# Patient Record
Sex: Male | Born: 1975 | Race: White | Hispanic: No | Marital: Married | Smoking: Never smoker
Health system: Southern US, Community
[De-identification: ages and names within clinical notes are randomized; demographics above are authoritative.]

## PROBLEM LIST (undated history)

## (undated) DIAGNOSIS — K760 Fatty (change of) liver, not elsewhere classified: Secondary | ICD-10-CM

## (undated) DIAGNOSIS — Z9109 Other allergy status, other than to drugs and biological substances: Secondary | ICD-10-CM

## (undated) DIAGNOSIS — I4719 Other supraventricular tachycardia: Secondary | ICD-10-CM

## (undated) DIAGNOSIS — N200 Calculus of kidney: Secondary | ICD-10-CM

## (undated) DIAGNOSIS — K5790 Diverticulosis of intestine, part unspecified, without perforation or abscess without bleeding: Secondary | ICD-10-CM

## (undated) DIAGNOSIS — N289 Disorder of kidney and ureter, unspecified: Secondary | ICD-10-CM

## (undated) HISTORY — PX: CHOLECYSTECTOMY: SHX55

## (undated) HISTORY — DX: Other supraventricular tachycardia: I47.19

## (undated) HISTORY — DX: Fatty (change of) liver, not elsewhere classified: K76.0

## (undated) HISTORY — DX: Diverticulosis of intestine, part unspecified, without perforation or abscess without bleeding: K57.90

## (undated) HISTORY — PX: KNEE SURGERY: SHX244

## (undated) HISTORY — DX: Calculus of kidney: N20.0

---

## 2016-06-14 ENCOUNTER — Encounter: Payer: Self-pay | Admitting: Emergency Medicine

## 2016-06-14 ENCOUNTER — Emergency Department (INDEPENDENT_AMBULATORY_CARE_PROVIDER_SITE_OTHER)
Admission: EM | Admit: 2016-06-14 | Discharge: 2016-06-14 | Disposition: A | Discharge status: Home / Self Care | Payer: BLUE CROSS/BLUE SHIELD | Source: Home / Self Care | Attending: Family Medicine | Admitting: Family Medicine

## 2016-06-14 DIAGNOSIS — IMO0002 Reserved for concepts with insufficient information to code with codable children: Secondary | ICD-10-CM

## 2016-06-14 DIAGNOSIS — L0211 Cutaneous abscess of neck: Secondary | ICD-10-CM

## 2016-06-14 DIAGNOSIS — L03221 Cellulitis of neck: Secondary | ICD-10-CM

## 2016-06-14 HISTORY — DX: Other allergy status, other than to drugs and biological substances: Z91.09

## 2016-06-14 MED ORDER — IBUPROFEN 600 MG PO TABS: 600.0000 mg | ORAL_TABLET | Freq: Four times a day (QID) | ORAL | 0 refills | Status: DC | PRN

## 2016-06-14 MED ORDER — DOXYCYCLINE HYCLATE 100 MG PO CAPS: 100.0000 mg | ORAL_CAPSULE | Freq: Two times a day (BID) | ORAL | 0 refills | Status: DC

## 2016-06-14 MED ORDER — HYDROCODONE-ACETAMINOPHEN 5-325 MG PO TABS: 1.0000 | ORAL_TABLET | Freq: Four times a day (QID) | ORAL | 0 refills | Status: DC | PRN

## 2016-06-14 NOTE — ED Triage Notes (Signed)
Pt c/o swollen abcess on back of neck x 1 week, redness, swelling, very painful, yellow drainage.

## 2016-06-14 NOTE — ED Provider Notes (Signed)
CSN: [id number]     Arrival date & time 06/14/16  1140 History   First MD Initiated Contact with Patient 06/14/16 1217     Chief Complaint  Patient presents with  . Abscess   (Consider location/radiation/quality/duration/timing/severity/associated sxs/prior Treatment) HPI Matthew Oconnor is a 40 y.o. male presenting to UC with c/o 1 week of gradually worsening painful, swollen, reddened sore on the back of his neck that has drained a small amount of yellow discharge. He has been taking Advil with mild relief. Pain is 7/10, aching and sore, keeps him up at night. Pt is unsure if he was bit by something or had an ingrown hair. No hx of similar abscess.    Past Medical History:  Diagnosis Date  . Environmental allergies    Past Surgical History:  Procedure Laterality Date  . CHOLECYSTECTOMY     Family History  Problem Relation Age of Onset  . Hypertension Other    Social History  Substance Use Topics  . Smoking status: Never Smoker  . Smokeless tobacco: Never Used  . Alcohol use Yes     Comment: 4 drinks a week    Review of Systems  Constitutional: Negative for chills and fever.  Gastrointestinal: Negative for nausea and vomiting.  Skin: Positive for color change and wound. Negative for rash.    Allergies  Flexeril [cyclobenzaprine]  Home Medications   Prior to Admission medications   Medication Sig Start Date End Date Taking? Authorizing Provider  doxycycline (VIBRAMYCIN) 100 MG capsule Take 1 capsule (100 mg total) by mouth 2 (two) times daily. One po bid x 7 days 06/14/16   Junius FinnerErin O'Malley, PA-C  HYDROcodone-acetaminophen (NORCO/VICODIN) 5-325 MG tablet Take 1-2 tablets by mouth every 6 (six) hours as needed for moderate pain or severe pain. 06/14/16   Junius FinnerErin O'Malley, PA-C  ibuprofen (ADVIL,MOTRIN) 600 MG tablet Take 1 tablet (600 mg total) by mouth every 6 (six) hours as needed. 06/14/16   Junius FinnerErin O'Malley, PA-C   Meds Ordered and Administered this Visit  Medications - No data  to display  BP 130/87 (BP Location: Left Arm)   Pulse 88   Temp 98 F (36.7 C) (Oral)   Ht 6' 0.5" (1.842 m)   Wt 235 lb 4 oz (106.7 kg)   SpO2 99%   BMI 31.47 kg/m  No data found.   Physical Exam  Constitutional: He is oriented to person, place, and time. He appears well-developed and well-nourished. No distress.  HENT:  Head: Normocephalic and atraumatic.  Eyes: EOM are normal.  Neck: Normal range of motion. Neck supple.  Cardiovascular: Normal rate.   Pulmonary/Chest: Effort normal.  Musculoskeletal: Normal range of motion.  Neurological: He is alert and oriented to person, place, and time.  Skin: Skin is warm and dry. Rash noted. He is not diaphoretic. There is erythema.  Posterior neck: 2cm area of erythema and induration. Centralized area of dried yellow discharge. No fluctuance.   Psychiatric: He has a normal mood and affect. His behavior is normal.  Nursing note and vitals reviewed.   Urgent Care Course   Clinical Course     Procedures (including critical care time)  Labs Review Labs Reviewed - No data to display  Imaging Review No results found.   MDM   1. Abscess or cellulitis, neck    Pt c/o worsening redness, pain and swelling at the back of his neck. Exam c/w cellulitis with underlying abscess. No fluctuance noted on exam. Centralized area of dried yellow discharge.  Area gentle massaged, no drainage coming from abscess. Do not believe I&D would be of benefit yet.  Will start pt on oral antibiotics and encouraged warm compresses several times a day to encourage abscess to drain. F/u in 3-4 days for recheck as abscess may benefit from I&D at that time.  Rx: Norco, ibuprofen, and doxycycline     Junius Finnerrin O'Malley, PA-C 06/14/16 1230

## 2016-08-12 ENCOUNTER — Emergency Department (INDEPENDENT_AMBULATORY_CARE_PROVIDER_SITE_OTHER)
Admission: EM | Admit: 2016-08-12 | Discharge: 2016-08-12 | Disposition: A | Discharge status: Home / Self Care | Payer: BLUE CROSS/BLUE SHIELD | Source: Home / Self Care | Attending: Family Medicine | Admitting: Family Medicine

## 2016-08-12 ENCOUNTER — Encounter: Payer: Self-pay | Admitting: Emergency Medicine

## 2016-08-12 DIAGNOSIS — L0211 Cutaneous abscess of neck: Secondary | ICD-10-CM

## 2016-08-12 MED ORDER — TRAMADOL HCL 50 MG PO TABS: 50.0000 mg | ORAL_TABLET | Freq: Four times a day (QID) | ORAL | 0 refills | Status: DC | PRN

## 2016-08-12 MED ORDER — MUPIROCIN 2 % EX OINT: TOPICAL_OINTMENT | CUTANEOUS | 0 refills | Status: DC

## 2016-08-12 MED ORDER — CLINDAMYCIN HCL 300 MG PO CAPS: 300.0000 mg | ORAL_CAPSULE | Freq: Four times a day (QID) | ORAL | 0 refills | Status: DC

## 2016-08-12 NOTE — ED Triage Notes (Signed)
Pt has abscess on right side of neck x3 days. Denies fever.

## 2016-08-12 NOTE — Discharge Instructions (Signed)
°  Tramadol is strong pain medication. While taking, do not drink alcohol, drive, or perform any other activities that requires focus while taking these medications.  ° °

## 2016-08-12 NOTE — ED Provider Notes (Signed)
CSN: [id number]     Arrival date & time 08/12/16  1143 History   First MD Initiated Contact with Patient 08/12/16 1205     Chief Complaint  Patient presents with  . Skin Ulcer   (Consider location/radiation/quality/duration/timing/severity/associated sxs/prior Treatment) HPI  Matthew Oconnor is a 41 y.o. male presenting to UC with c/o gradually worsening pain, swelling, and redness to the Right side of neck for 3-4 days.  Pain is aching and throbbing, 4/10.  This morning he took off  The bandage and noticed a small amount of white pus, scratched the sore, resulting in a "hole" in the abscess.  Denies fever, chills, n/v/d. He also notes a smaller sore on the back of his neck along his hairline but notes it is much smaller, pain is minimal.  He was seen on 06/14/16 for an abscess. Abscess was too indurated at that time for I&D.  He was treated with doxycycline, symptoms then resolved.  Pt is concerned new skin sores are coming back so quickly as he cannot recall any changes to his daily routine, soaps, lotions, or grooming habits.  He does not have a PCP.   Past Medical History:  Diagnosis Date  . Environmental allergies    Past Surgical History:  Procedure Laterality Date  . CHOLECYSTECTOMY     Family History  Problem Relation Age of Onset  . Hypertension Other    Social History  Substance Use Topics  . Smoking status: Never Smoker  . Smokeless tobacco: Never Used  . Alcohol use Yes     Comment: 4 drinks a week    Review of Systems  Constitutional: Negative for chills and fever.  Gastrointestinal: Negative for diarrhea, nausea and vomiting.  Musculoskeletal: Negative for arthralgias and myalgias.  Skin: Positive for color change and wound.    Allergies  Flexeril [cyclobenzaprine]  Home Medications   Prior to Admission medications   Medication Sig Start Date End Date Taking? Authorizing Provider  clindamycin (CLEOCIN) 300 MG capsule Take 1 capsule (300 mg total) by mouth 4  (four) times daily. X 7 days 08/12/16   Junius FinnerErin O'Malley, PA-C  doxycycline (VIBRAMYCIN) 100 MG capsule Take 1 capsule (100 mg total) by mouth 2 (two) times daily. One po bid x 7 days 06/14/16   Junius FinnerErin O'Malley, PA-C  HYDROcodone-acetaminophen (NORCO/VICODIN) 5-325 MG tablet Take 1-2 tablets by mouth every 6 (six) hours as needed for moderate pain or severe pain. 06/14/16   Junius FinnerErin O'Malley, PA-C  ibuprofen (ADVIL,MOTRIN) 600 MG tablet Take 1 tablet (600 mg total) by mouth every 6 (six) hours as needed. 06/14/16   Junius FinnerErin O'Malley, PA-C  mupirocin ointment (BACTROBAN) 2 % Apply to skin sore 3 times daily for 5 days. Apply to each nostril twice daily for 5 days 08/12/16   Junius FinnerErin O'Malley, PA-C  traMADol (ULTRAM) 50 MG tablet Take 1 tablet (50 mg total) by mouth every 6 (six) hours as needed. 08/12/16   Junius FinnerErin O'Malley, PA-C   Meds Ordered and Administered this Visit  Medications - No data to display  BP 126/81 (BP Location: Right Arm)   Pulse 80   Temp 97.8 F (36.6 C) (Oral)   Wt 242 lb (109.8 kg)   SpO2 98%   BMI 32.37 kg/m  No data found.   Physical Exam  Constitutional: He is oriented to person, place, and time. He appears well-developed and well-nourished. No distress.  HENT:  Head: Normocephalic and atraumatic.    1cm open abscess with small amount of blood and  purulent discharge present. Surrounding tendenress and induration.  Eyes: EOM are normal.  Neck: Normal range of motion. Neck supple.  Cardiovascular: Normal rate.   Pulmonary/Chest: Effort normal.  Musculoskeletal: Normal range of motion. He exhibits no edema.  Neurological: He is alert and oriented to person, place, and time.  Skin: Skin is warm and dry. Rash noted. He is not diaphoretic. There is erythema.  Abscess to Right side of neck.  Psychiatric: He has a normal mood and affect. His behavior is normal.  Nursing note and vitals reviewed.   Urgent Care Course     .Marland KitchenIncision and Drainage Date/Time: 08/12/2016 12:39  PM Performed by: Junius Finner Authorized by: Donna Christen A   Consent:    Consent obtained:  Verbal   Consent given by:  Patient   Risks discussed:  Bleeding, incomplete drainage and pain   Alternatives discussed:  Alternative treatment (antibiotics only) Location:    Type:  Abscess   Size:  1   Location:  Neck   Neck location:  R anterior (Right anterior/lateral) Pre-procedure details:    Skin preparation:  Antiseptic wash Anesthesia (see MAR for exact dosages):    Anesthesia method:  Local infiltration   Local anesthetic:  Lidocaine 2% WITH epi Procedure type:    Complexity:  Simple Procedure details:    Needle aspiration: no     Incision types:  Single straight   Incision depth:  Dermal   Scalpel blade:  11   Wound management:  Probed and deloculated   Drainage:  Bloody and purulent   Drainage amount:  Scant   Wound treatment:  Wound left open   Packing materials:  None Post-procedure details:    Patient tolerance of procedure:  Tolerated well, no immediate complications    Wound culture sent.   Labs Review Labs Reviewed  WOUND CULTURE    Imaging Review No results found.    MDM   1. Abscess of skin of neck    Abscess started to drain spontaneously, however, in order to ensure majority of discharge expelled and to obtain culture, I&D performed.  Rx: Clindamycin, mupirocin ointment and tramadol  Home care instructions provided. Encouraged f/u with PCP and/or dermatologist if sores keep coming back. F/u with UC in 4-5 days if current symptoms not improving.     Junius Finner, PA-C 08/12/16 1242

## 2016-08-15 ENCOUNTER — Telehealth: Payer: Self-pay | Admitting: Emergency Medicine

## 2016-08-15 LAB — WOUND CULTURE: Gram Stain: NONE SEEN

## 2016-08-15 NOTE — Telephone Encounter (Signed)
Culture grew Staph, make sure to finish the ABT, keep it clean and dry, wash your hands, change bed lines, dont share bath towels, etc.

## 2017-08-12 ENCOUNTER — Emergency Department (HOSPITAL_COMMUNITY)
Admission: EM | Admit: 2017-08-12 | Discharge: 2017-08-12 | Disposition: A | Discharge status: Home / Self Care | Payer: BLUE CROSS/BLUE SHIELD | Attending: Emergency Medicine | Admitting: Emergency Medicine

## 2017-08-12 ENCOUNTER — Encounter (HOSPITAL_COMMUNITY): Payer: Self-pay

## 2017-08-12 ENCOUNTER — Emergency Department (HOSPITAL_COMMUNITY): Payer: BLUE CROSS/BLUE SHIELD

## 2017-08-12 DIAGNOSIS — K5732 Diverticulitis of large intestine without perforation or abscess without bleeding: Secondary | ICD-10-CM | POA: Diagnosis not present

## 2017-08-12 DIAGNOSIS — K5790 Diverticulosis of intestine, part unspecified, without perforation or abscess without bleeding: Secondary | ICD-10-CM

## 2017-08-12 DIAGNOSIS — K760 Fatty (change of) liver, not elsewhere classified: Secondary | ICD-10-CM | POA: Diagnosis not present

## 2017-08-12 DIAGNOSIS — K429 Umbilical hernia without obstruction or gangrene: Secondary | ICD-10-CM | POA: Insufficient documentation

## 2017-08-12 DIAGNOSIS — N200 Calculus of kidney: Secondary | ICD-10-CM | POA: Diagnosis not present

## 2017-08-12 DIAGNOSIS — R1031 Right lower quadrant pain: Secondary | ICD-10-CM | POA: Diagnosis present

## 2017-08-12 DIAGNOSIS — Z79899 Other long term (current) drug therapy: Secondary | ICD-10-CM | POA: Insufficient documentation

## 2017-08-12 LAB — CBC
HCT: 43.5 % (ref 39.0–52.0)
Hemoglobin: 13.9 g/dL (ref 13.0–17.0)
MCH: 26.3 pg (ref 26.0–34.0)
MCHC: 32 g/dL (ref 30.0–36.0)
MCV: 82.2 fL (ref 78.0–100.0)
Platelets: 212 10*3/uL (ref 150–400)
RBC: 5.29 MIL/uL (ref 4.22–5.81)
RDW: 12.7 % (ref 11.5–15.5)
WBC: 5.9 10*3/uL (ref 4.0–10.5)

## 2017-08-12 LAB — BASIC METABOLIC PANEL
ANION GAP: 10 (ref 5–15)
BUN: 9 mg/dL (ref 6–20)
CO2: 24 mmol/L (ref 22–32)
Calcium: 8.8 mg/dL — ABNORMAL LOW (ref 8.9–10.3)
Chloride: 101 mmol/L (ref 101–111)
Creatinine, Ser: 0.81 mg/dL (ref 0.61–1.24)
GFR calc Af Amer: >60 mL/min (ref >60)
GLUCOSE: 178 mg/dL — AB (ref 65–99)
Potassium: 3.8 mmol/L (ref 3.5–5.1)
SODIUM: 135 mmol/L (ref 135–145)

## 2017-08-12 LAB — URINALYSIS, ROUTINE W REFLEX MICROSCOPIC
BILIRUBIN URINE: NEGATIVE
Glucose, UA: NEGATIVE mg/dL
KETONES UR: 20 mg/dL — AB
LEUKOCYTES UA: NEGATIVE
Nitrite: NEGATIVE
Protein, ur: 100 mg/dL — AB
SPECIFIC GRAVITY, URINE: 1.029 (ref 1.005–1.030)
pH: 5 (ref 5.0–8.0)

## 2017-08-12 MED ORDER — SODIUM CHLORIDE 0.9 % IV BOLUS (SEPSIS)
500.0000 mL | Freq: Once | INTRAVENOUS | Status: AC
Start: 2017-08-12 — End: 2017-08-12
  Administered 2017-08-12: 500 mL via INTRAVENOUS

## 2017-08-12 MED ORDER — IOPAMIDOL (ISOVUE-300) INJECTION 61%
100.0000 mL | Freq: Once | INTRAVENOUS | Status: AC | PRN
  Administered 2017-08-12: 100 mL via INTRAVENOUS

## 2017-08-12 MED ORDER — HYDROCODONE-ACETAMINOPHEN 5-325 MG PO TABS: 1.0000 | ORAL_TABLET | Freq: Four times a day (QID) | ORAL | 0 refills | Status: DC | PRN

## 2017-08-12 MED ORDER — ONDANSETRON HCL 4 MG PO TABS: 4.0000 mg | ORAL_TABLET | Freq: Three times a day (TID) | ORAL | 0 refills | Status: DC | PRN

## 2017-08-12 MED ORDER — TAMSULOSIN HCL 0.4 MG PO CAPS: 0.4000 mg | ORAL_CAPSULE | Freq: Every day | ORAL | 0 refills | Status: DC

## 2017-08-12 MED ORDER — MORPHINE SULFATE (PF) 4 MG/ML IV SOLN
4.0000 mg | Freq: Once | INTRAVENOUS | Status: AC
  Administered 2017-08-12: 4 mg via INTRAVENOUS
  Filled 2017-08-12: qty 1

## 2017-08-12 NOTE — ED Notes (Signed)
Pt returned from ct

## 2017-08-12 NOTE — ED Triage Notes (Signed)
Pt reports woke up 2 hours ago with sudden onset of bilateral flank pain.  Reports pain then went to r flank.  Reports pt varies in intensity.  Reports voided once this am without difficulty but then thought he had to void again later this morning but couldn't go.

## 2017-08-12 NOTE — ED Provider Notes (Signed)
[facility]Y [facility]PART[facility] Provider Note   CSN: [id number] Arrival date & time: 08/12/17  [id number]     History   Chief Complaint Chief Complaint  Patient presents with  . Flank Pain    HPI Ninetta LightsBrian Bromell is a 42 y.o. male.  HPI   42 y/o male with a h/o cholecystectomy presenting to the ED today c/o sudden onset flank pain that began this morning when he woke up. Pain was initially in the bilateral flanks however now has moved to the right flank.  Intermittently radiates to the right mid abdomen.  Pain waxes and wanes.  It is currently 6/10, but at worst it was "27 "/10.  Patient took 800 mg ibuprofen at 7:30 AM, which has improved his pain somewhat.  Also reports nausea, malodorous urine, and dull, mild bilateral scrotal pain.  Denies fevers, chills, chest pain, SOB, vomiting, diarrhea, constipation, dysuria, penile discharge.  States he was able to urinate this morning with no issues. Now reports he is having some hesitancy.   Denies a personal h/o nephrolithiasis, but does has +family history.   Past Medical History:  Diagnosis Date  . Environmental allergies     There are no active problems to display for this patient.   Past Surgical History:  Procedure Laterality Date  . CHOLECYSTECTOMY         Home Medications    Prior to Admission medications   Medication Sig Start Date End Date Taking? Authorizing Provider  famotidine (PEPCID) 20 MG tablet Take 20 mg by mouth daily.   Yes [provider]  fexofenadine (ALLEGRA) 180 MG tablet Take 180 mg by mouth daily.   Yes [provider]  ibuprofen (ADVIL,MOTRIN) 600 MG tablet Take 1 tablet (600 mg total) by mouth every 6 (six) hours as needed. 06/14/16  Yes Phelps, Vangie BickerErin O, PA-C  HYDROcodone-acetaminophen (NORCO/VICODIN) 5-325 MG tablet Take 1 tablet by mouth every 6 (six) hours as needed. 08/12/17   Lequita Meadowcroft S, PA-C  ondansetron (ZOFRAN) 4 MG tablet Take 1 tablet (4 mg total) by mouth every 8 (eight)  hours as needed for nausea or vomiting. 08/12/17   Ayce Pietrzyk S, PA-C  tamsulosin (FLOMAX) 0.4 MG CAPS capsule Take 1 capsule (0.4 mg total) by mouth daily. 08/12/17   Analiz Tvedt S, PA-C    Family History Family History  Problem Relation Age of Onset  . Hypertension Other     Social History Social History   Tobacco Use  . Smoking status: Never Smoker  . Smokeless tobacco: Current User    Types: Chew  Substance Use Topics  . Alcohol use: Yes    Comment: 4 drinks a week  . Drug use: No     Allergies   Flexeril [cyclobenzaprine]   Review of Systems Review of Systems  Constitutional: Negative for chills and fever.  HENT: Negative for ear pain and sore throat.   Eyes: Negative for pain and visual disturbance.  Respiratory: Negative for cough and shortness of breath.   Cardiovascular: Negative for chest pain and palpitations.  Gastrointestinal: Positive for abdominal pain and nausea. Negative for constipation, diarrhea and vomiting.  Genitourinary: Positive for difficulty urinating, flank pain and testicular pain. Negative for discharge, dysuria, frequency, penile swelling and scrotal swelling.  Musculoskeletal: Negative for arthralgias and back pain.  Skin: Negative for color change and rash.  Neurological: Negative for seizures and syncope.  All other systems reviewed and are negative.    Physical Exam Updated Vital Signs BP 108/70  Pulse 64   Temp 97.7 F (36.5 C) (Oral)   Resp 16   Ht 6' (1.829 m)   Wt 113.4 kg (250 lb)   SpO2 100%   BMI 33.91 kg/m   Physical Exam  Constitutional: He appears well-developed and well-nourished. He appears distressed.  HENT:  Head: Normocephalic and atraumatic.  Eyes: Conjunctivae are normal.  Neck: Normal range of motion. Neck supple.  Cardiovascular: Normal rate, regular rhythm, normal heart sounds and intact distal pulses.  No murmur heard. Pulmonary/Chest: Effort normal and breath sounds normal. No respiratory  distress. He has no wheezes.  Abdominal: Soft. Bowel sounds are normal. He exhibits no distension and no mass. There is no tenderness. There is no guarding.  Right CVA TTP. No left CVA TTP. No overlying ecchymosis.  Genitourinary: Penis normal. Cremasteric reflex is present. Right testis shows no mass, no swelling and no tenderness. Left testis shows no mass, no swelling and no tenderness. Circumcised.  Musculoskeletal: He exhibits no edema.  No TTP to paraspinous muscles  Neurological: He is alert.  Skin: Skin is warm and dry. He is not diaphoretic.  Psychiatric: He has a normal mood and affect.  Nursing note and vitals reviewed.    ED Treatments / Results  Labs (all labs ordered are listed, but only abnormal results are displayed) Labs Reviewed  URINALYSIS, ROUTINE W REFLEX MICROSCOPIC - Abnormal; Notable for the following components:      Result Value   Color, Urine AMBER (*)    APPearance HAZY (*)    Hgb urine dipstick LARGE (*)    Ketones, ur 20 (*)    Protein, ur 100 (*)    Bacteria, UA RARE (*)    Squamous Epithelial / LPF 0-5 (*)    All other components within normal limits  BASIC METABOLIC PANEL - Abnormal; Notable for the following components:   Glucose, Bld 178 (*)    Calcium 8.8 (*)    All other components within normal limits  CBC    EKG  EKG Interpretation None       Radiology Ct Abdomen Pelvis W Contrast  Result Date: 08/12/2017 CLINICAL DATA:  Acute generalized abdominal pain, sudden onset of BILATERAL flank pain at 0630 hours today, difficulty urinating, history of cholecystectomy EXAM: CT ABDOMEN AND PELVIS WITH CONTRAST TECHNIQUE: Multidetector CT imaging of the abdomen and pelvis was performed using the standard protocol following bolus administration of intravenous contrast. Sagittal and coronal MPR images reconstructed from axial data set. CONTRAST:  ISOVUE-300 IOPAMIDOL (ISOVUE-300) INJECTION 61% IV. No oral contrast. COMPARISON:  None  FINDINGS: Lower chest: Lung bases clear Hepatobiliary: Diffuse fatty infiltration of liver. Gallbladder surgically absent. Pancreas: Normal appearance Spleen: Normal appearance Adrenals/Urinary Tract: Adrenal glands normal appearance. Mild RIGHT hydronephrosis secondary to a 4 mm RIGHT UPJ calculus. No renal mass or additional urinary tract calcification. No ureteral dilatation or LEFT hydronephrosis. Bladder unremarkable. Stomach/Bowel: Mild descending and sigmoid colonic diverticulosis without evidence of diverticulitis. Normal appendix. Stomach and small bowel loops unremarkable for technique. Vascular/Lymphatic: Few pelvic phleboliths. Vascular structures otherwise unremarkable. No adenopathy. Reproductive: Unremarkable prostate gland and seminal vesicles. Other: Small umbilical hernia containing fat. No free air or free fluid Musculoskeletal: Osseous structures unremarkable. IMPRESSION: Mild RIGHT hydronephrosis secondary to a 4 mm RIGHT UPJ calculus. Diffuse fatty infiltration of liver. Distal colonic diverticulosis without evidence of diverticulitis. Small umbilical hernia. Electronically Signed   By: Ulyses Southward M.D.   On: 08/12/2017 09:56    Procedures Procedures (including critical care time)  Medications Ordered in ED Medications  sodium chloride 0.9 % bolus 500 mL (0 mLs Intravenous Stopped 08/12/17 0945)  morphine 4 MG/ML injection 4 mg (4 mg Intravenous Given 08/12/17 0914)  iopamidol (ISOVUE-300) 61 % injection 100 mL (100 mLs Intravenous Contrast Given 08/12/17 0936)     Initial Impression / Assessment and Plan / ED Course  I have reviewed the triage vital signs and the nursing notes.  Pertinent labs & imaging results that were available during my care of the patient were reviewed by me and considered in my medical decision making (see chart for details).  Clinical Course as of Aug 13 1847  Wed Aug 12, 2017  6730 42 year old male with acute onset of right flank radiating into right  mid abdomen pain severe intensity waxing and waning colicky associated with some urinary hesitancy.  Also associated with nausea.  His abdominal exam is benign.  He is getting labs fluids and urinalysis and his put in for a CT abdomen with contrast.  Differential includes ureterolithiasis, pyelonephritis, musculoskeletal, appendicitis.  [MB]    Clinical Course User Index [MB] Terrilee Files, MD    Discussed pt presentation and exam findings with Dr. Charm Barges, who will evaluate the pt. Discussed the plan to order labs and ct abd/pelvis. Dr. Charm Barges recommends ordering Ct abd/pelvis with contrast since pt has not had a h/o nephrolithiasis and may have additional underlying pathology that would be better visualized with contrast.   10:24 Discussed Pt case with Dr. Sherron Monday, urology, who recommends that pt call the office tomorrow to schedule a f/u appt. Advised to d/c patient with flomax, zofran, and pain medication.   Reviewed pt in [place] [facility] and pt has not had narcotic Rx since 03/2017.  10:46 Rechecked pt. He states he is feeling improved after morphine. Pain is now 2/10. He is laying in bed in NAD.  Discussed the labs and CT results.  Discussed the plan for discharge with Flomax, Zofran, and pain medication. advised him to take scheduled ibuprofen and to take Norco for breakthrough pain.  Advised not to work, drive, or operate heavy machinery while taking Norco.  Advised him to contact [facility] either today or tomorrow morning to schedule follow-up appointment.  Advised patient to follow-up with primary care doctor about additional diagnoses noted on CT scan today.  Advised him to return to the ER for any fevers, vomiting, intractable pain, or any new or worsening symptoms.  Final Clinical Impressions(s) / ED Diagnoses   Final diagnoses:  Nephrolithiasis  Fatty liver  Umbilical hernia without obstruction and without gangrene  Diverticulosis of intestine without bleeding,  unspecified intestinal tract location   43 y/o M with family hx of nephrolithiasis presenting with acute onset right flank pain radiating to right mid abdomen. Labs reassuring with no WBC elevation and normal kidney function. UA with large amount of hematuria, ketonuria, and proteinuria. CT abdomen showing 4mm right ureteral stone. Consulted Dr. Sherron Monday, urology, who made medication recommendations and will see pt in office.  Patient is nontoxic, nonseptic appearing, in no apparent distress.  Patient's pain and other symptoms adequately managed in emergency department.  Fluid bolus given.  Patient does not meet the SIRS or Sepsis criteria.  Abdominal exam does not have a surgical abdomen and there are no peritoneal signs.  No indication of appendicitis, bowel obstruction, bowel perforation, or diverticulitis.  Patient discharged home with flomax, zofran and pain meds and given strict instructions for follow-up with urology. Discussed all incidental findings on  CT and advised pt to f/u with pcp regarding these new diagnosis.  I have also discussed reasons to return immediately to the ER.  Patient expresses understanding and agrees with plan.  ED Discharge Orders        Ordered    tamsulosin (FLOMAX) 0.4 MG CAPS capsule  Daily     08/12/17 1032    ondansetron (ZOFRAN) 4 MG tablet  Every 8 hours PRN     08/12/17 1032    HYDROcodone-acetaminophen (NORCO/VICODIN) 5-325 MG tablet  Every 6 hours PRN     08/12/17 1032       Doriana Mazurkiewicz S, PA-C 08/12/17 1051    Dio Giller, [place], PA-C 08/12/17 1849    Terrilee Files, MD 08/12/17 [phone]

## 2018-02-18 ENCOUNTER — Encounter (HOSPITAL_COMMUNITY): Payer: Self-pay | Admitting: Emergency Medicine

## 2018-02-18 ENCOUNTER — Other Ambulatory Visit: Payer: Self-pay

## 2018-02-18 ENCOUNTER — Emergency Department (HOSPITAL_COMMUNITY)
Admission: EM | Admit: 2018-02-18 | Discharge: 2018-02-18 | Disposition: A | Discharge status: Home / Self Care | Payer: BLUE CROSS/BLUE SHIELD | Attending: Emergency Medicine | Admitting: Emergency Medicine

## 2018-02-18 ENCOUNTER — Emergency Department (HOSPITAL_COMMUNITY): Payer: BLUE CROSS/BLUE SHIELD

## 2018-02-18 DIAGNOSIS — R1030 Lower abdominal pain, unspecified: Secondary | ICD-10-CM | POA: Diagnosis present

## 2018-02-18 DIAGNOSIS — N200 Calculus of kidney: Secondary | ICD-10-CM | POA: Diagnosis not present

## 2018-02-18 DIAGNOSIS — Z79899 Other long term (current) drug therapy: Secondary | ICD-10-CM | POA: Diagnosis not present

## 2018-02-18 HISTORY — DX: Disorder of kidney and ureter, unspecified: N28.9

## 2018-02-18 LAB — CBC
HCT: 42.2 % (ref 39.0–52.0)
Hemoglobin: 14.2 g/dL (ref 13.0–17.0)
MCH: 27.2 pg (ref 26.0–34.0)
MCHC: 33.6 g/dL (ref 30.0–36.0)
MCV: 80.7 fL (ref 78.0–100.0)
PLATELETS: 233 10*3/uL (ref 150–400)
RBC: 5.23 MIL/uL (ref 4.22–5.81)
RDW: 13.3 % (ref 11.5–15.5)
WBC: 8.1 10*3/uL (ref 4.0–10.5)

## 2018-02-18 LAB — URINALYSIS, ROUTINE W REFLEX MICROSCOPIC
Bacteria, UA: NONE SEEN
Bilirubin Urine: NEGATIVE
GLUCOSE, UA: 150 mg/dL — AB
Ketones, ur: NEGATIVE mg/dL
Leukocytes, UA: NEGATIVE
Nitrite: NEGATIVE
PH: 5 (ref 5.0–8.0)
PROTEIN: 30 mg/dL — AB
RBC / HPF: >50 RBC/hpf — ABNORMAL HIGH (ref 0–5)
Specific Gravity, Urine: 1.023 (ref 1.005–1.030)

## 2018-02-18 LAB — COMPREHENSIVE METABOLIC PANEL
ALT: 71 U/L — ABNORMAL HIGH (ref 0–44)
AST: 42 U/L — AB (ref 15–41)
Albumin: 4 g/dL (ref 3.5–5.0)
Alkaline Phosphatase: 67 U/L (ref 38–126)
Anion gap: 7 (ref 5–15)
BUN: 5 mg/dL — AB (ref 6–20)
CHLORIDE: 102 mmol/L (ref 98–111)
CO2: 25 mmol/L (ref 22–32)
Calcium: 8.5 mg/dL — ABNORMAL LOW (ref 8.9–10.3)
Creatinine, Ser: 0.99 mg/dL (ref 0.61–1.24)
GFR calc Af Amer: >60 mL/min (ref >60)
Glucose, Bld: 196 mg/dL — ABNORMAL HIGH (ref 70–99)
Potassium: 3.6 mmol/L (ref 3.5–5.1)
SODIUM: 134 mmol/L — AB (ref 135–145)
Total Bilirubin: 1.2 mg/dL (ref 0.3–1.2)
Total Protein: 7.2 g/dL (ref 6.5–8.1)

## 2018-02-18 LAB — LIPASE, BLOOD: LIPASE: 31 U/L (ref 11–51)

## 2018-02-18 MED ORDER — OXYCODONE-ACETAMINOPHEN 5-325 MG PO TABS: 2.0000 | ORAL_TABLET | ORAL | 0 refills | Status: DC | PRN

## 2018-02-18 MED ORDER — TAMSULOSIN HCL 0.4 MG PO CAPS: 0.4000 mg | ORAL_CAPSULE | ORAL | 1 refills | Status: DC | PRN

## 2018-02-18 MED ORDER — MORPHINE SULFATE (PF) 4 MG/ML IV SOLN
4.0000 mg | Freq: Once | INTRAVENOUS | Status: AC
Start: 2018-02-18 — End: 2018-02-18
  Administered 2018-02-18: 4 mg via INTRAVENOUS
  Filled 2018-02-18: qty 1

## 2018-02-18 MED ORDER — KETOROLAC TROMETHAMINE 30 MG/ML IJ SOLN
30.0000 mg | Freq: Once | INTRAMUSCULAR | Status: AC
  Administered 2018-02-18: 30 mg via INTRAVENOUS
  Filled 2018-02-18: qty 1

## 2018-02-18 NOTE — ED Provider Notes (Signed)
Blood pressure (!) 162/83, pulse 93, temperature 98.6 F (37 C), temperature source Oral, resp. rate 20, height 6\' 1"  (1.854 m), weight 117.9 kg, SpO2 100 %.  Assuming care from Dr. Charm BargesButler.  In short, Matthew Oconnor is a 42 y.o. male with a chief complaint of Abdominal Pain .  Refer to the original H&P for additional details.  The current plan of care is to follow up on CT.  CT reviewed. 5 mm kidney stone noted. No other findings. Patient pain is well controlled. No evidence of UTI. Plan for Urology follow up by phone in the AM and pain control.   Matthew BeneJoshua Griffey Nicasio, MD    Matthew Oconnor, Matthew Saber G, MD 02/19/18 [phone]

## 2018-02-18 NOTE — Discharge Instructions (Signed)
You have been seen in the Emergency Department (ED) today for pain that we believe based on your workup, is caused by kidney stones.  As we have discussed, please drink plenty of fluids.  Please make a follow up appointment with the physician(s) listed elsewhere in this documentation. ° °You may take pain medication as needed but ONLY as prescribed.  Please also take your prescribed Flomax daily.  We also recommend that you take over-the-counter ibuprofen regularly according to label instructions over the next 5 days.  Take it with meals to minimize stomach discomfort. ° °Please see your doctor as soon as possible as stones may take 1-3 weeks to pass and you may require additional care or medications. ° °Do not drink alcohol, drive or participate in any other potentially dangerous activities while taking opiate pain medication as it may make you sleepy. Do not take this medication with any other sedating medications, either prescription or over-the-counter. If you were prescribed Percocet or Vicodin, do not take these with acetaminophen (Tylenol) as it is already contained within these medications. °  °Take Percocet as needed for severe pain.  This medication is an opiate (or narcotic) pain medication and can be habit forming.  Use it as little as possible to achieve adequate pain control.  Do not use or use it with extreme caution if you have a history of opiate abuse or dependence.  If you are on a pain contract with your primary care doctor or a pain specialist, be sure to let them know you were prescribed this medication today from the Emergency Department.  This medication is intended for your use only - do not give any to anyone else and keep it in a secure place where nobody else, especially children, have access to it.  It will also cause or worsen constipation, so you may want to consider taking an over-the-counter stool softener while you are taking this medication. ° °Return to the Emergency Department  (ED) or call your doctor if you have any worsening pain, fever, painful urination, are unable to urinate, or develop other symptoms that concern you. ° ° ° °Kidney Stones °Kidney stones (urolithiasis) are deposits that form inside your kidneys. The intense pain is caused by the stone moving through the urinary tract. When the stone moves, the ureter goes into spasm around the stone. The stone is usually passed in the urine.  °CAUSES  °A disorder that makes certain neck glands produce too much parathyroid hormone (primary hyperparathyroidism). °A buildup of uric acid crystals, similar to gout in your joints. °Narrowing (stricture) of the ureter. °A kidney obstruction present at birth (congenital obstruction). °Previous surgery on the kidney or ureters. °Numerous kidney infections. °SYMPTOMS  °Feeling sick to your stomach (nauseous). °Throwing up (vomiting). °Blood in the urine (hematuria). °Pain that usually spreads (radiates) to the groin. °Frequency or urgency of urination. °DIAGNOSIS  °Taking a history and physical exam. °Blood or urine tests. °CT scan. °Occasionally, an examination of the inside of the urinary bladder (cystoscopy) is performed. °TREATMENT  °Observation. °Increasing your fluid intake. °Extracorporeal shock wave lithotripsy--This is a noninvasive procedure that uses shock waves to break up kidney stones. °Surgery may be needed if you have severe pain or persistent obstruction. There are various surgical procedures. Most of the procedures are performed with the use of small instruments. Only small incisions are needed to accommodate these instruments, so recovery time is minimized. °The size, location, and chemical composition are all important variables that will determine the proper   choice of action for you. Talk to your health care provider to better understand your situation so that you will minimize the risk of injury to yourself and your kidney.  °HOME CARE INSTRUCTIONS  °Drink enough water  and fluids to keep your urine clear or pale yellow. This will help you to pass the stone or stone fragments. °Strain all urine through the provided strainer. Keep all particulate matter and stones for your health care provider to see. The stone causing the pain may be as small as a grain of salt. It is very important to use the strainer each and every time you pass your urine. The collection of your stone will allow your health care provider to analyze it and verify that a stone has actually passed. The stone analysis will often identify what you can do to reduce the incidence of recurrences. °Only take over-the-counter or prescription medicines for pain, discomfort, or fever as directed by your health care provider. °Keep all follow-up visits as told by your health care provider. This is important. °Get follow-up X-rays if required. The absence of pain does not always mean that the stone has passed. It may have only stopped moving. If the urine remains completely obstructed, it can cause loss of kidney function or even complete destruction of the kidney. It is your responsibility to make sure X-rays and follow-ups are completed. Ultrasounds of the kidney can show blockages and the status of the kidney. Ultrasounds are not associated with any radiation and can be performed easily in a matter of minutes. °Make changes to your daily diet as told by your health care provider. You may be told to: °Limit the amount of salt that you eat. °Eat 5 or more servings of fruits and vegetables each day. °Limit the amount of meat, poultry, fish, and eggs that you eat. °Collect a 24-hour urine sample as told by your health care provider. You may need to collect another urine sample every 6-12 months. °SEEK MEDICAL CARE IF: °You experience pain that is progressive and unresponsive to any pain medicine you have been prescribed. °SEEK IMMEDIATE MEDICAL CARE IF:  °Pain cannot be controlled with the prescribed medicine. °You have a  fever or shaking chills. °The severity or intensity of pain increases over 18 hours and is not relieved by pain medicine. °You develop a new onset of abdominal pain. °You feel faint or pass out. °You are unable to urinate. °  °This information is not intended to replace advice given to you by your health care provider. Make sure you discuss any questions you have with your health care provider. °  °Document Released: 06/30/2005 Document Revised: 03/21/2015 Document Reviewed: 12/01/2012 °Elsevier Interactive Patient Education ©2016 [employer] ° ° °

## 2018-02-18 NOTE — ED Provider Notes (Signed)
[facility] EMERGENCY DEPARTMENT Provider Note   CSN: [id number] Arrival date & time: 02/18/18  1858     History   Chief Complaint Chief Complaint  Patient presents with  . Abdominal Pain    HPI Matthew Oconnor is a 42 y.o. male.  He has a prior history of kidney stones.  He is presenting here with about a month of on and off low abdominal pain.  He was associated with some urinary frequency and he tried cranberry juice which seemed to settle his symptoms for a while.  His low abdominal pain came back today and was much more severe and diffuse and associated with green diarrhea.  There is been some associated nausea.  No fevers no chills.  The pain seems colicky in nature and is improved with moving around.  He is tried some Flomax without improvement.  The history is provided by the patient.  Abdominal Pain   This is a new problem. The current episode started 12 to 24 hours ago. The problem occurs constantly. The problem has not changed since onset.The pain is associated with an unknown factor. The pain is located in the suprapubic region. The quality of the pain is colicky. The pain is moderate. Associated symptoms include diarrhea, nausea and frequency. Pertinent negatives include fever, melena, vomiting, constipation, dysuria, hematuria and headaches. Nothing aggravates the symptoms. The symptoms are relieved by certain positions. Past workup includes CT scan.    Past Medical History:  Diagnosis Date  . Environmental allergies   . Renal disorder    kidney stones    There are no active problems to display for this patient.   Past Surgical History:  Procedure Laterality Date  . CHOLECYSTECTOMY    . KNEE SURGERY          Home Medications    Prior to Admission medications   Medication Sig Start Date End Date Taking? Authorizing Provider  famotidine (PEPCID) 20 MG tablet Take 20 mg by mouth daily.   Yes [provider]  fexofenadine (ALLEGRA) 180 MG tablet Take 180  mg by mouth daily.   Yes [provider]  tamsulosin (FLOMAX) 0.4 MG CAPS capsule Take 0.8 mg by mouth as needed.   Yes [provider]    Family History Family History  Problem Relation Age of Onset  . Hypertension Other     Social History Social History   Tobacco Use  . Smoking status: Never Smoker  . Smokeless tobacco: Current User    Types: Chew  Substance Use Topics  . Alcohol use: Yes    Comment: 4 drinks a week  . Drug use: No     Allergies   Flexeril [cyclobenzaprine]   Review of Systems Review of Systems  Constitutional: Negative for fever.  HENT: Negative for sore throat.   Eyes: Negative for visual disturbance.  Respiratory: Negative for shortness of breath.   Cardiovascular: Negative for chest pain.  Gastrointestinal: Positive for abdominal pain, diarrhea and nausea. Negative for constipation, melena and vomiting.  Genitourinary: Positive for frequency and urgency. Negative for dysuria and hematuria.  Musculoskeletal: Negative for back pain.  Skin: Negative for rash.  Neurological: Negative for headaches.     Physical Exam Updated Vital Signs BP (!) 162/83 (BP Location: Right Arm)   Pulse 93   Temp 98.6 F (37 C) (Oral)   Resp 20   Ht 6\' 1"  (1.854 m)   Wt 117.9 kg   SpO2 100%   BMI 34.30 kg/m  Physical Exam  Constitutional: He appears well-developed and well-nourished.  HENT:  Head: Normocephalic and atraumatic.  Eyes: Conjunctivae are normal.  Neck: Neck supple.  Cardiovascular: Normal rate and regular rhythm.  No murmur heard. Pulmonary/Chest: Effort normal and breath sounds normal. No respiratory distress.  Abdominal: Soft. Normal appearance. There is no tenderness. There is no rigidity and no guarding.  Musculoskeletal: He exhibits no edema or deformity.  Neurological: He is alert.  Skin: Skin is warm and dry.  Psychiatric: He has a normal mood and affect.  Nursing note and vitals reviewed.    ED Treatments /  Results  Labs (all labs ordered are listed, but only abnormal results are displayed) Labs Reviewed  COMPREHENSIVE METABOLIC PANEL - Abnormal; Notable for the following components:      Result Value   Sodium 134 (*)    Glucose, Bld 196 (*)    BUN 5 (*)    Calcium 8.5 (*)    AST 42 (*)    ALT 71 (*)    All other components within normal limits  URINALYSIS, ROUTINE W REFLEX MICROSCOPIC - Abnormal; Notable for the following components:   Glucose, UA 150 (*)    Hgb urine dipstick LARGE (*)    Protein, ur 30 (*)    RBC / HPF >50 (*)    All other components within normal limits  LIPASE, BLOOD  CBC    EKG None  Radiology Ct Renal Stone Study  Result Date: 02/18/2018 CLINICAL DATA:  Acute onset of right lower quadrant abdominal pain and dysuria. EXAM: CT ABDOMEN AND PELVIS WITHOUT CONTRAST TECHNIQUE: Multidetector CT imaging of the abdomen and pelvis was performed following the standard protocol without IV contrast. COMPARISON:  CT of the abdomen and pelvis from 08/12/2017 FINDINGS: Lower chest: The visualized lung bases are grossly clear. The visualized portions of the mediastinum are unremarkable. Hepatobiliary: There is diffuse fatty infiltration within the liver. The patient is status post cholecystectomy. The common bile duct remains normal in caliber. Pancreas: The pancreas is within normal limits. Spleen: The spleen is unremarkable in appearance. Adrenals/Urinary Tract: The adrenal glands are unremarkable in appearance. Mild right-sided hydronephrosis is noted, with an obstructing 5 mm stone at the distal right ureter, just above the right vesicoureteral junction. Right-sided perinephric stranding and fluid are seen. No nonobstructing renal stones are identified. The left kidney is grossly unremarkable in appearance. Stomach/Bowel: The stomach is unremarkable in appearance. The small bowel is within normal limits. The appendix is normal in caliber, without evidence of appendicitis.  Scattered diverticulosis is noted along the descending and sigmoid colon, without evidence of diverticulitis. Vascular/Lymphatic: The abdominal aorta is unremarkable in appearance. The inferior vena cava is grossly unremarkable. No retroperitoneal lymphadenopathy is seen. No pelvic sidewall lymphadenopathy is identified. Reproductive: The bladder is relatively decompressed and not well characterized. A urachal remnant is incidentally noted. The prostate remains normal in size. Other: No additional soft tissue abnormalities are seen. Musculoskeletal: No acute osseous abnormalities are identified. The visualized musculature is unremarkable in appearance. IMPRESSION: 1. Mild right-sided hydronephrosis, with an obstructing 5 mm stone at the distal right ureter, just above the right vesicoureteral junction. 2. Diffuse fatty infiltration within the liver. 3. Scattered diverticulosis along the descending and sigmoid colon, without evidence of diverticulitis. Electronically Signed   By: Roanna RaiderJeffery  Chang M.D.   On: 02/18/2018 22:16    Procedures Procedures (including critical care time)  Medications Ordered in ED Medications  ketorolac (TORADOL) 30 MG/ML injection 30 mg (30 mg Intravenous  Given 02/18/18 2126)  morphine 4 MG/ML injection 4 mg (4 mg Intravenous Given 02/18/18 2127)     Initial Impression / Assessment and Plan / ED Course  I have reviewed the triage vital signs and the nursing notes.  Pertinent labs & imaging results that were available during my care of the patient were reviewed by me and considered in my medical decision making (see chart for details).      Final Clinical Impressions(s) / ED Diagnoses   Final diagnoses:  Lower abdominal pain  Kidney stone    ED Discharge Orders         Ordered    tamsulosin (FLOMAX) 0.4 MG CAPS capsule  As needed     02/18/18 2137    oxyCODONE-acetaminophen (PERCOCET/ROXICET) 5-325 MG tablet  Every 4 hours PRN     02/18/18 2137             Terrilee Files, MD 02/19/18 [phone]

## 2018-02-18 NOTE — ED Triage Notes (Addendum)
Pt c/o lower abd pain that feels like pressure with dysuria. Pt states his bowel movement today was lime green.

## 2018-08-03 IMAGING — CT CT ABD-PELV W/ CM
2 of 4 series · 16 of 46 positions shown, 18 images · IV contrast (Isovue)
Comparison: None

CLINICAL DATA: Acute generalized abdominal pain, sudden onset of
BILATERAL flank pain at 4774 hours today, difficulty urinating,
history of cholecystectomy

EXAM:
CT ABDOMEN AND PELVIS WITH CONTRAST
TECHNIQUE: Multidetector CT imaging of the abdomen and pelvis was performed
using the standard protocol following bolus administration of
intravenous contrast. Sagittal and coronal MPR images reconstructed
from axial data set.
CONTRAST:  100mL J73ECO-MLL IOPAMIDOL (J73ECO-MLL) INJECTION 61% IV.
No oral contrast.

[Series 2: axial st · axial · 0.95mm/px · z∈[+910,+1425]mm · 13 of 113 slices shown, 15 images]
[im 5/113  soft-tissue]
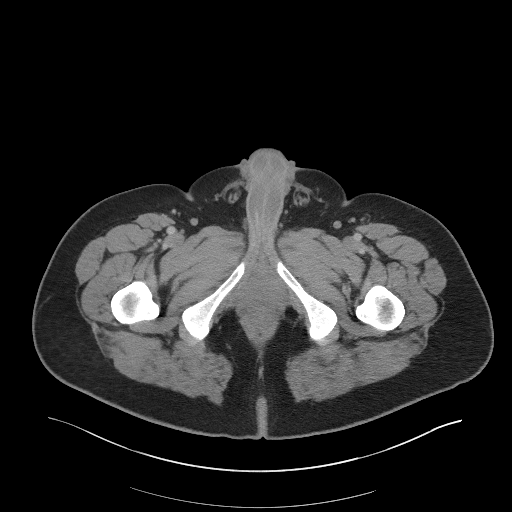
[im 5/113  bone]
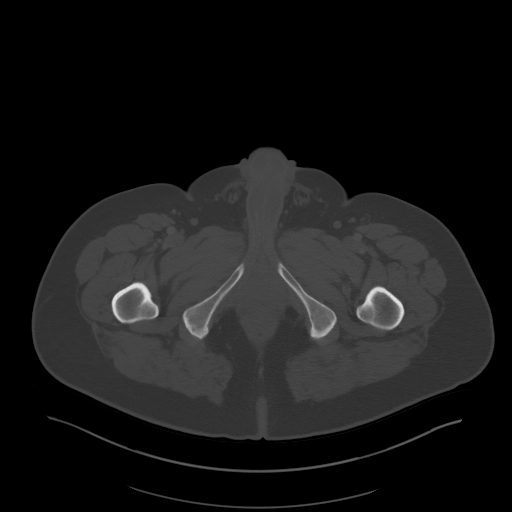
[im 15/113  soft-tissue]
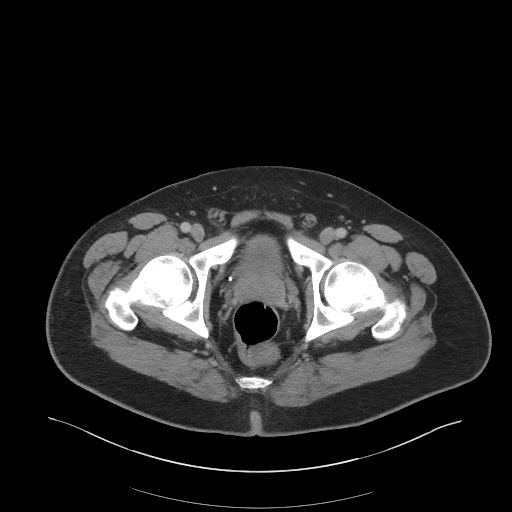
[im 25/113  soft-tissue]
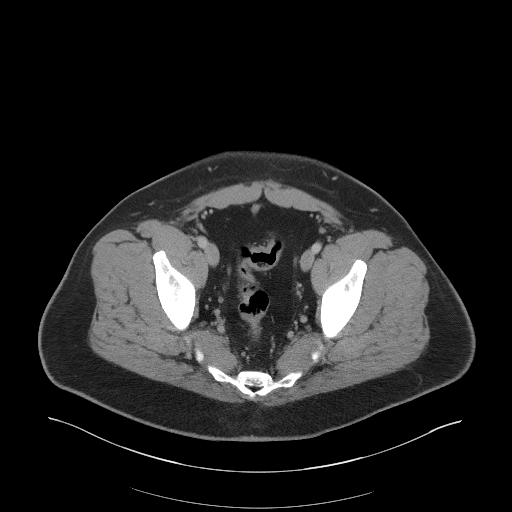
[im 30/113  soft-tissue]
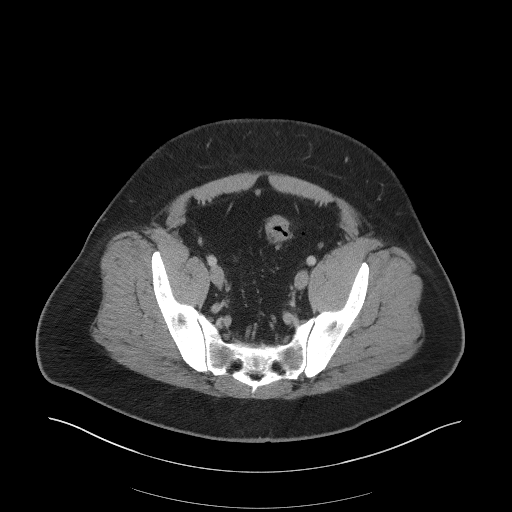
[im 39/113  soft-tissue]
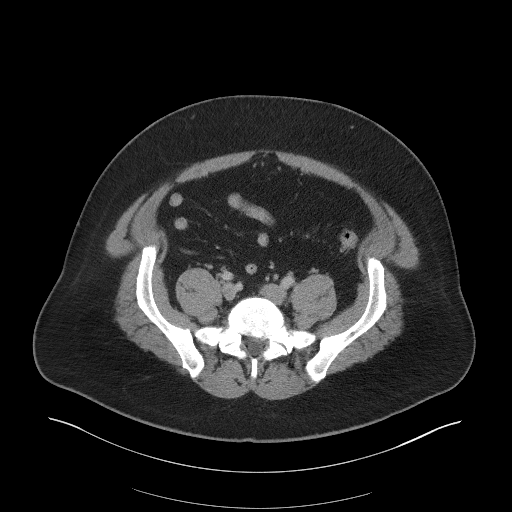
[im 49/113  soft-tissue]
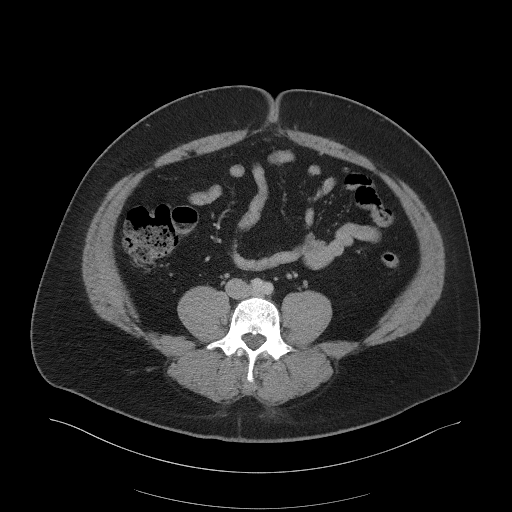
[im 59/113  soft-tissue]
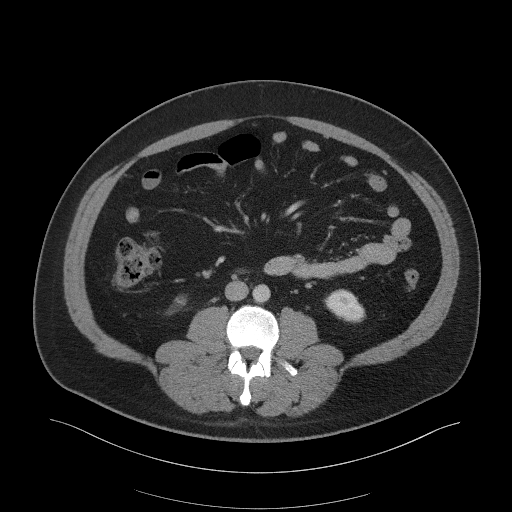
[im 64/113  soft-tissue]
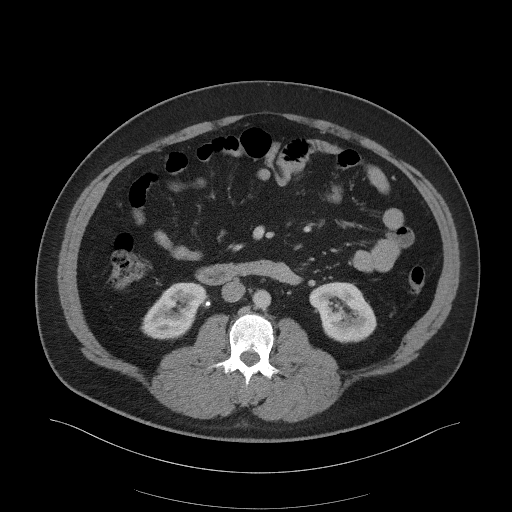
[im 74/113  soft-tissue]
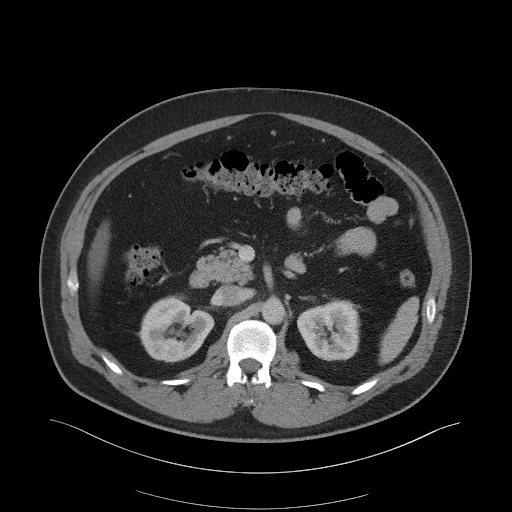
[im 74/113  bone]
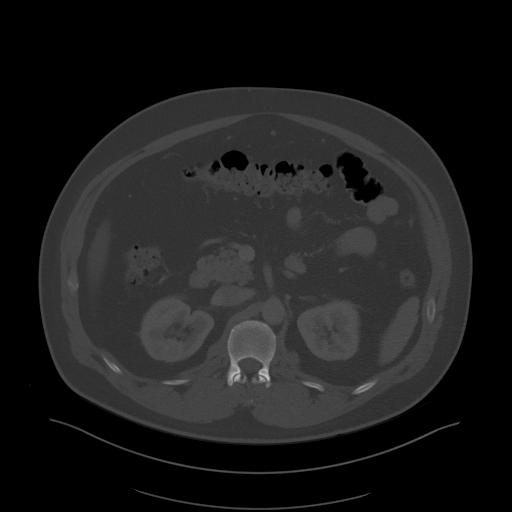
[im 83/113  soft-tissue]
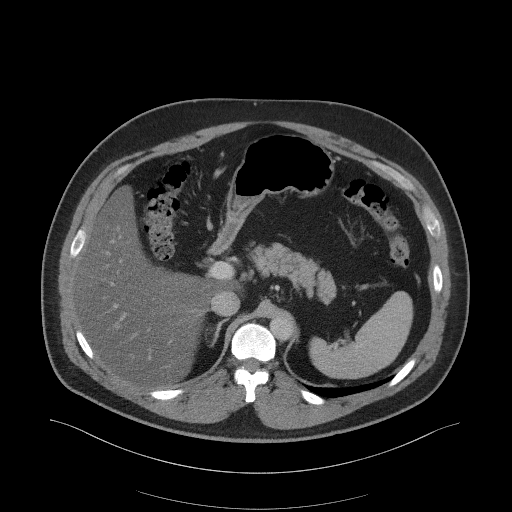
[im 88/113  soft-tissue]
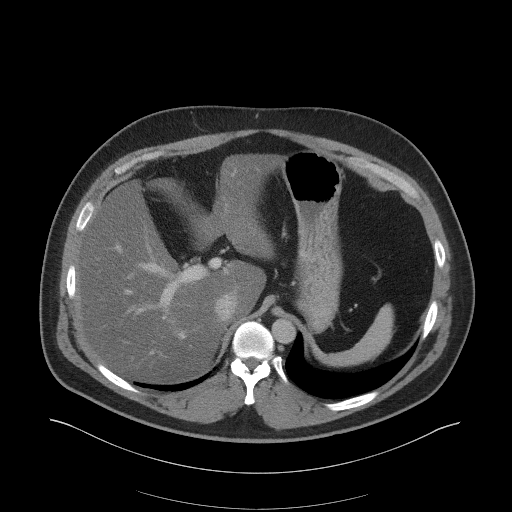
[im 98/113  soft-tissue]
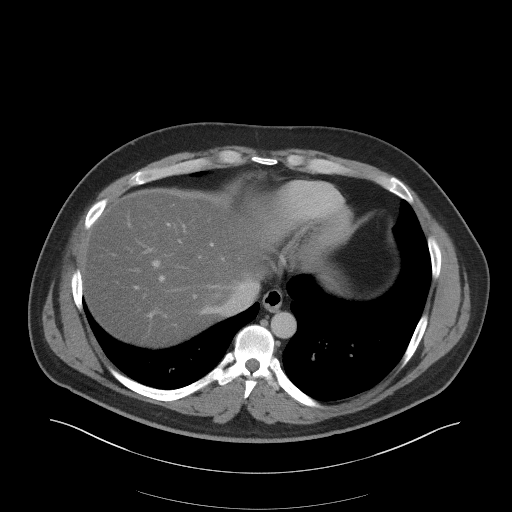
[im 108/113  soft-tissue]
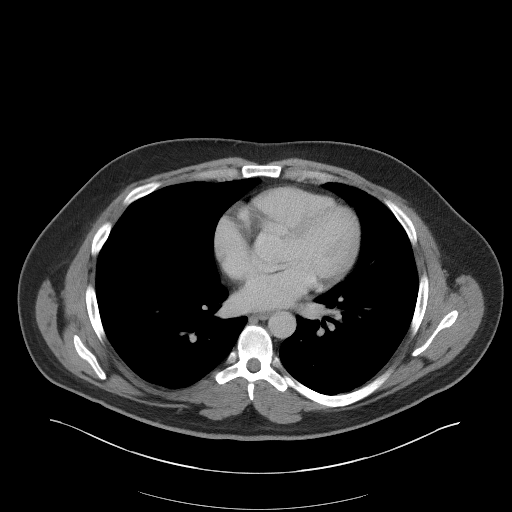

[Series 5: coronal st · coronal · 0.82mm/px · 3 of 117 slices shown]
[im 39/117  soft-tissue]
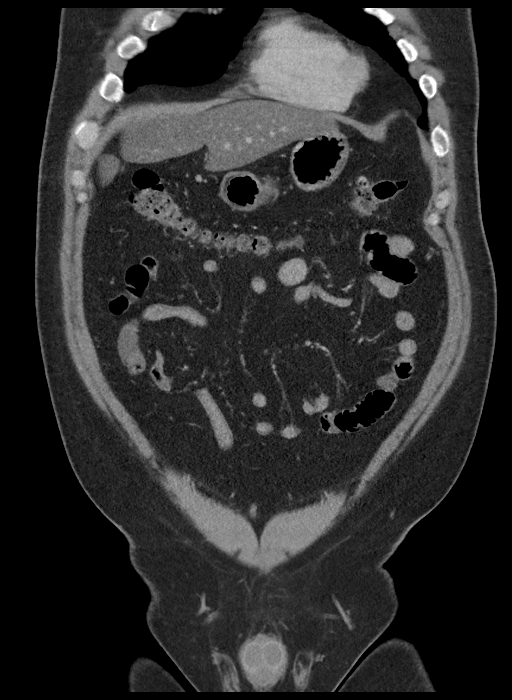
[im 52/117  soft-tissue]
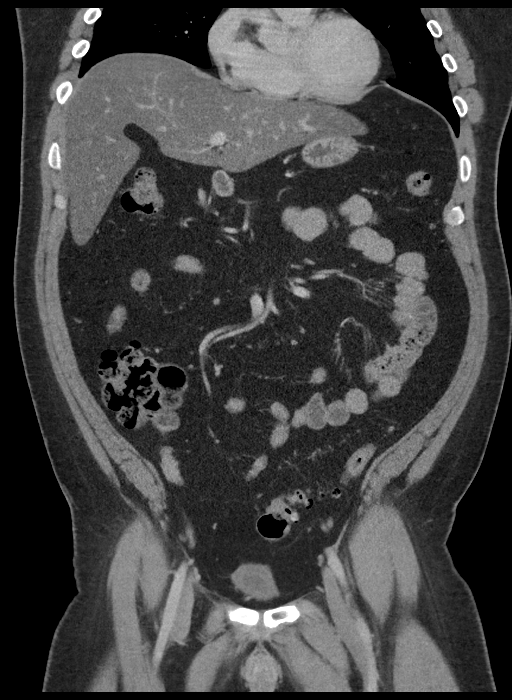
[im 65/117  soft-tissue]
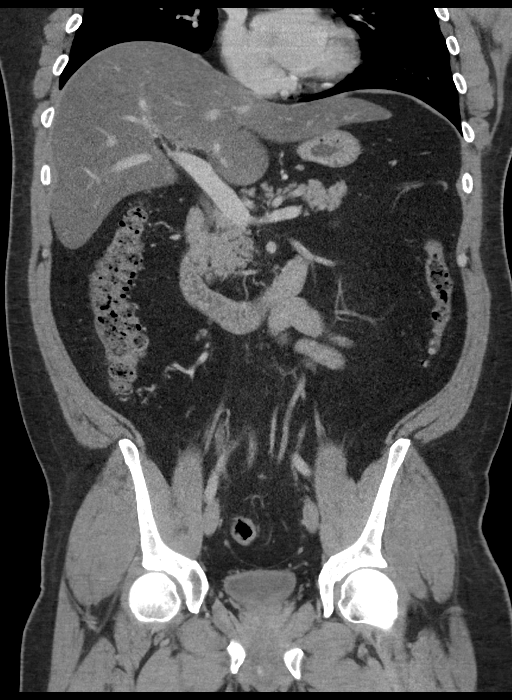

[16 of 46 positions shown; findings below may reference images not displayed]

FINDINGS: Lower chest: Lung bases clear

Hepatobiliary: Diffuse fatty infiltration of liver. Gallbladder
surgically absent.

Pancreas: Normal appearance

Spleen: Normal appearance

Adrenals/Urinary Tract: Adrenal glands normal appearance. Mild RIGHT
hydronephrosis secondary to a 4 mm RIGHT UPJ calculus. No renal mass
or additional urinary tract calcification.

No ureteral dilatation or LEFT hydronephrosis. Bladder unremarkable.

Stomach/Bowel: Mild descending and sigmoid colonic diverticulosis
without evidence of diverticulitis. Normal appendix. Stomach and
small bowel loops unremarkable for technique.

Vascular/Lymphatic: Few pelvic phleboliths. Vascular structures
otherwise unremarkable. No adenopathy.

Reproductive: Unremarkable prostate gland and seminal vesicles.

Other: Small umbilical hernia containing fat. No free air or free
fluid

Musculoskeletal: Osseous structures unremarkable.
IMPRESSION: Mild RIGHT hydronephrosis secondary to a 4 mm RIGHT UPJ calculus.

Diffuse fatty infiltration of liver.

Distal colonic diverticulosis without evidence of diverticulitis.

Small umbilical hernia.

## 2019-06-15 ENCOUNTER — Other Ambulatory Visit: Payer: Self-pay

## 2019-06-15 DIAGNOSIS — Z20822 Contact with and (suspected) exposure to covid-19: Secondary | ICD-10-CM

## 2019-06-18 LAB — NOVEL CORONAVIRUS, NAA: SARS-CoV-2, NAA: NOT DETECTED

## 2022-08-20 ENCOUNTER — Encounter (HOSPITAL_COMMUNITY): Payer: Self-pay | Admitting: Emergency Medicine

## 2022-08-20 ENCOUNTER — Other Ambulatory Visit: Payer: Self-pay

## 2022-08-20 ENCOUNTER — Emergency Department (HOSPITAL_COMMUNITY)
Admission: EM | Admit: 2022-08-20 | Discharge: 2022-08-20 | Disposition: A | Discharge status: Home / Self Care | Payer: BC Managed Care – PPO | Attending: Emergency Medicine | Admitting: Emergency Medicine

## 2022-08-20 ENCOUNTER — Emergency Department (HOSPITAL_COMMUNITY): Payer: BC Managed Care – PPO

## 2022-08-20 DIAGNOSIS — R002 Palpitations: Secondary | ICD-10-CM

## 2022-08-20 DIAGNOSIS — R531 Weakness: Secondary | ICD-10-CM | POA: Diagnosis not present

## 2022-08-20 DIAGNOSIS — R000 Tachycardia, unspecified: Secondary | ICD-10-CM | POA: Diagnosis not present

## 2022-08-20 LAB — COMPREHENSIVE METABOLIC PANEL
ALT: 54 U/L — ABNORMAL HIGH (ref 0–44)
AST: 34 U/L (ref 15–41)
Albumin: 4.4 g/dL (ref 3.5–5.0)
Alkaline Phosphatase: 83 U/L (ref 38–126)
Anion gap: 9 (ref 5–15)
BUN: 9 mg/dL (ref 6–20)
CO2: 26 mmol/L (ref 22–32)
Calcium: 8.9 mg/dL (ref 8.9–10.3)
Chloride: 100 mmol/L (ref 98–111)
Creatinine, Ser: 0.83 mg/dL (ref 0.61–1.24)
GFR, Estimated: >60 mL/min (ref >60)
Glucose, Bld: 263 mg/dL — ABNORMAL HIGH (ref 70–99)
Potassium: 3.7 mmol/L (ref 3.5–5.1)
Sodium: 135 mmol/L (ref 135–145)
Total Bilirubin: 2.2 mg/dL — ABNORMAL HIGH (ref 0.3–1.2)
Total Protein: 7.8 g/dL (ref 6.5–8.1)

## 2022-08-20 LAB — CBC WITH DIFFERENTIAL/PLATELET
Abs Immature Granulocytes: 0.04 10*3/uL (ref 0.00–0.07)
Basophils Absolute: 0.1 10*3/uL (ref 0.0–0.1)
Basophils Relative: 1 %
Eosinophils Absolute: 0.1 10*3/uL (ref 0.0–0.5)
Eosinophils Relative: 2 %
HCT: 50.3 % (ref 39.0–52.0)
Hemoglobin: 16.7 g/dL (ref 13.0–17.0)
Immature Granulocytes: 1 %
Lymphocytes Relative: 22 %
Lymphs Abs: 1.1 10*3/uL (ref 0.7–4.0)
MCH: 27.2 pg (ref 26.0–34.0)
MCHC: 33.2 g/dL (ref 30.0–36.0)
MCV: 82.1 fL (ref 80.0–100.0)
Monocytes Absolute: 0.5 10*3/uL (ref 0.1–1.0)
Monocytes Relative: 11 %
Neutro Abs: 3 10*3/uL (ref 1.7–7.7)
Neutrophils Relative %: 63 %
Platelets: 270 10*3/uL (ref 150–400)
RBC: 6.13 MIL/uL — ABNORMAL HIGH (ref 4.22–5.81)
RDW: 13.1 % (ref 11.5–15.5)
WBC: 4.8 10*3/uL (ref 4.0–10.5)
nRBC: 0 % (ref 0.0–0.2)

## 2022-08-20 LAB — TSH: TSH: 1.368 u[IU]/mL (ref 0.350–4.500)

## 2022-08-20 LAB — MAGNESIUM: Magnesium: 2 mg/dL (ref 1.7–2.4)

## 2022-08-20 MED ORDER — SODIUM CHLORIDE 0.9 % IV BOLUS
500.0000 mL | Freq: Once | INTRAVENOUS | Status: AC
  Administered 2022-08-20: 500 mL via INTRAVENOUS

## 2022-08-20 MED ORDER — METOPROLOL TARTRATE 25 MG PO TABS: 25.0000 mg | ORAL_TABLET | Freq: Two times a day (BID) | ORAL | 2 refills | Status: DC

## 2022-08-20 MED ORDER — METOPROLOL TARTRATE 5 MG/5ML IV SOLN
5.0000 mg | Freq: Once | INTRAVENOUS | Status: AC
  Administered 2022-08-20: 5 mg via INTRAVENOUS
  Filled 2022-08-20: qty 5

## 2022-08-20 MED ORDER — METOPROLOL TARTRATE 25 MG PO TABS
25.0000 mg | ORAL_TABLET | Freq: Two times a day (BID) | ORAL | Status: DC
  Administered 2022-08-20: 25 mg via ORAL
  Filled 2022-08-20: qty 1

## 2022-08-20 NOTE — ED Provider Notes (Signed)
[facility] Provider Note   CSN: [id number] Arrival date & time: 08/20/22  1216     History  Chief Complaint  Patient presents with   Tachycardia    Matthew Oconnor is a 47 y.o. male.  Patient states that he has been having palpitations for the last 3 days.  It seems to be getting worse.  Patient has no medical problems  The history is provided by the patient and medical records. No language interpreter was used.  Palpitations Palpitations quality:  Fast Onset quality:  Unable to specify Timing:  Intermittent Progression:  Waxing and waning Chronicity:  New Context: anxiety   Relieved by:  Nothing Worsened by:  Nothing Associated symptoms: no back pain, no chest pain and no cough        Home Medications Prior to Admission medications   Medication Sig Start Date End Date Taking? Authorizing Provider  famotidine (PEPCID) 20 MG tablet Take 20 mg by mouth daily.    [provider]  fexofenadine (ALLEGRA) 180 MG tablet Take 180 mg by mouth daily.    [provider]  oxyCODONE-acetaminophen (PERCOCET/ROXICET) 5-325 MG tablet Take 2 tablets by mouth every 4 (four) hours as needed for severe pain. 02/18/18   Hayden Rasmussen, MD  tamsulosin (FLOMAX) 0.4 MG CAPS capsule Take 1 capsule (0.4 mg total) by mouth as needed. 02/18/18   Hayden Rasmussen, MD      Allergies    Flexeril [cyclobenzaprine]    Review of Systems   Review of Systems  Constitutional:  Negative for appetite change and fatigue.  HENT:  Negative for congestion, ear discharge and sinus pressure.   Eyes:  Negative for discharge.  Respiratory:  Negative for cough.   Cardiovascular:  Positive for palpitations. Negative for chest pain.  Gastrointestinal:  Negative for abdominal pain and diarrhea.  Genitourinary:  Negative for frequency and hematuria.  Musculoskeletal:  Negative for back pain.  Skin:  Negative for rash.  Neurological:  Negative for  seizures and headaches.  Psychiatric/Behavioral:  Negative for hallucinations.     Physical Exam Updated Vital Signs BP (!) 141/105   Pulse (!) 131   Temp 97.6 F (36.4 C) (Oral)   Resp 15   Ht 6' 1"$  (1.854 m)   Wt 113.4 kg   SpO2 99%   BMI 32.98 kg/m  Physical Exam Vitals and nursing note reviewed.  Constitutional:      Appearance: He is well-developed.  HENT:     Head: Normocephalic.     Nose: Nose normal.  Eyes:     General: No scleral icterus.    Conjunctiva/sclera: Conjunctivae normal.  Neck:     Thyroid: No thyromegaly.  Cardiovascular:     Rate and Rhythm: Tachycardia present. Rhythm irregular.     Heart sounds: No murmur heard.    No friction rub. No gallop.  Pulmonary:     Breath sounds: No stridor. No wheezing or rales.  Chest:     Chest wall: No tenderness.  Abdominal:     General: There is no distension.     Tenderness: There is no abdominal tenderness. There is no rebound.  Musculoskeletal:        General: Normal range of motion.     Cervical back: Neck supple.  Lymphadenopathy:     Cervical: No cervical adenopathy.  Skin:    Findings: No erythema or rash.  Neurological:     Mental Status: He is alert and oriented  to person, place, and time.     Motor: No abnormal muscle tone.     Coordination: Coordination normal.  Psychiatric:        Behavior: Behavior normal.     ED Results / Procedures / Treatments   Labs (all labs ordered are listed, but only abnormal results are displayed) Labs Reviewed  CBC WITH DIFFERENTIAL/PLATELET  COMPREHENSIVE METABOLIC PANEL  TSH  MAGNESIUM    EKG EKG Interpretation  Date/Time:  Wednesday August 20 2022 14:26:41 EST Ventricular Rate:  71 PR Interval:  170 QRS Duration: 105 QT Interval:  372 QTC Calculation: 405 R Axis:   51 Text Interpretation: Sinus rhythm Confirmed by Cindee Lame [phone]) on 08/20/2022 3:22:07 PM  Radiology DG Chest Port 1 View  Result Date: 08/20/2022 CLINICAL DATA:   Weakness.  Palpitations EXAM: PORTABLE CHEST 1 VIEW COMPARISON:  None Available. FINDINGS: The heart size and mediastinal contours are within normal limits. Both lungs are clear. The visualized skeletal structures are unremarkable. IMPRESSION: No active disease. Electronically Signed   By: Davina Poke D.O.   On: 08/20/2022 14:41    Procedures Procedures    Medications Ordered in ED Medications  metoprolol tartrate (LOPRESSOR) tablet 25 mg (25 mg Oral Given 08/20/22 1526)  sodium chloride 0.9 % bolus 500 mL (0 mLs Intravenous Stopped 08/20/22 1510)  metoprolol tartrate (LOPRESSOR) injection 5 mg (5 mg Intravenous Given 08/20/22 1436)    ED Course/ Medical Decision Making/ A&P Clinical Course as of 08/22/22 1133  Wed Aug 20, 2022  1603 Magnesium: 2.0 [HN]  1829 Comprehensive metabolic panel(!) unremarkable [HN]  1829 CBC with Differential(!) Unremarkable [HN]  1829 HR now well controlled after IV and PO lopressor. Patient seen evaluated by Dr. Harl Bowie.  Patient be discharged with Lopressor 25 mg p.o. [HN]    Clinical Course User Index [HN] Audley Hose, MD                             Medical Decision Making Amount and/or Complexity of Data Reviewed Labs: ordered. Decision-making details documented in ED Course. Radiology: ordered. ECG/medicine tests: ordered.  Risk Prescription drug management.  This patient presents to the ED for concern of palpitations, this involves an extensive number of treatment options, and is a complaint that carries with it a high risk of complications and morbidity.  The differential diagnosis includes anxiety, atrial fibs   Co morbidities that complicate the patient evaluation  None   Additional history obtained:  Additional history obtained from patient External records from outside source obtained and reviewed including hospital records   Lab Tests:  I Ordered, and personally interpreted labs.  The pertinent results include: CBC and  chemistries unremarkable except for glucose 263 bilirubin 2.2   Imaging Studies ordered:  I ordered imaging studies including chest x-ray I independently visualized and interpreted imaging which showed negative I agree with the radiologist interpretation   Cardiac Monitoring: / EKG:  The patient was maintained on a cardiac monitor.  I personally viewed and interpreted the cardiac monitored which showed an underlying rhythm of: Normal sinus rhythm with PACs   Consultations Obtained:  I requested consultation with the cardiology,  and discussed lab and imaging findings as well as pertinent plan - they recommend: Lopressor twice a day   Problem List / ED Course / Critical interventions / Medication management  Irregular rhythm with PACs and atrial tach.  Elevated glucose I ordered medication including Lopressor for PACs  Reevaluation of the patient after these medicines showed that the patient improved I have reviewed the patients home medicines and have made adjustments as needed   Social Determinants of Health:  None   Test / Admission - Considered:  None  Episodes of PACs with atrial tach.  Labs pending and disposition will be determined by Dr. Mayra Neer        Final Clinical Impression(s) / ED Diagnoses Final diagnoses:  None    Rx / DC Orders ED Discharge Orders     None         Milton Ferguson, MD 08/22/22 1134

## 2022-08-20 NOTE — ED Provider Notes (Signed)
6:31 PM Assumed care of patient from off-going team. For more details, please see note from same day.  In brief, this is a 47 y.o. male p/w palpitations, found to have atrial tachycardia.  For more information please see note from same day.  Plan/Dispo at time of sign-out & ED Course since sign-out: [ ]  labs, HR, cards  BP 121/89   Pulse 61   Temp 97.6 F (36.4 C) (Oral)   Resp 15   Ht 6\' 1"  (1.854 m)   Wt 113.4 kg   SpO2 97%   BMI 32.98 kg/m    ED Course:   Clinical Course as of 08/20/22 1831  Wed Aug 20, 2022  1603 Magnesium: 2.0 [HN]  1829 Comprehensive metabolic panel(!) unremarkable [HN]  1829 CBC with Differential(!) Unremarkable [HN]  1829 HR now well controlled after IV and PO lopressor. Patient seen evaluated by Dr. Harl Bowie.  Patient be discharged with Lopressor 25 mg p.o. [HN]    Clinical Course User Index [HN] Audley Hose, MD    Dispo: DC with discharge instructions and return precautions, cards f/u ------------------------------- Cindee Lame, MD Emergency Medicine  This note was created using dictation software, which may contain spelling or grammatical errors.   Audley Hose, MD 08/20/22 [phone]

## 2022-08-20 NOTE — ED Triage Notes (Signed)
Patient c/o intermittent rapid heart beat x1 week that is progressively get worse. Patient states "I thought it was just a panic attack because it would go away." Patient states that today it has been happening every 5 minutes. Per smart watch it was 173 today when getting out of car. Denies any cardiac hx. Denies any shortness of breath. Per patient has had some dizziness when it occurred yesterday.

## 2022-08-20 NOTE — Progress Notes (Signed)
Patient discussed with ER staff. Presents with palpitations. Tele shows intermittent PACs and runs of atach. Labs are pending, received IV lopressor 5mg  with episodes resolving. Wrote for lopressor 25mg  bid first dose to be given now. F/u labs, I think should be able to control with beta blocker in ER and discharge on lopressor 25mg  bid. We can arrange outpatient cardiology follow up   Matthew Abts MD

## 2022-08-20 NOTE — Discharge Instructions (Signed)
Thank you for coming to [facility] Emergency Department. You were seen for palpitations and tachycardia. We did an exam, labs, and imaging, and these showed an atrial tachycardia.  You improved with metoprolol IV.  You evaluated cardiology Dr. Harl Bowie who recommended starting metoprolol tartrate (Lopressor) 25 mg twice per day.  They will arrange for follow-up in cardiology clinic and will call and make an appointment within 1 to 2 weeks.  Do not hesitate to return to the ED or call 911 if you experience: -Worsening symptoms -Chest pain, shortness of breath -Lightheadedness, passing out -Fevers/chills -Anything else that concerns you

## 2022-08-20 NOTE — Progress Notes (Signed)
Front desk helped with arranging new pt appt - they have notified him of appt info by cell.

## 2022-08-20 NOTE — ED Provider Notes (Signed)
3:20 PM Assumed care of patient from off-going team. For more details, please see note from same day.  In brief, this is a 47 y.o. male w/ tachycardia. Fib flutter here with rates in 170s. Received lopressor which improved symptoms. D/w cardiology who states that if labs okay, can be DC'd with lopressor and cards clinic f/u.   Plan/Dispo at time of sign-out & ED Course since sign-out: [ ]$  labs, cards  BP (!) 150/93   Pulse 73   Temp 97.6 F (36.4 C) (Oral)   Resp 16   Ht 6' 1"$  (1.854 m)   Wt 113.4 kg   SpO2 100%   BMI 32.98 kg/m    ED Course:   Clinical Course as of 08/30/22 1745  Wed Aug 20, 2022  1603 Magnesium: 2.0 [HN]  1829 Comprehensive metabolic panel(!) unremarkable [HN]  1829 CBC with Differential(!) Unremarkable [HN]  1829 HR now well controlled after IV and PO lopressor. Patient seen evaluated by Dr. Harl Bowie.  Patient be discharged with Lopressor 25 mg p.o. [HN]    Clinical Course User Index [HN] Audley Hose, MD    Dispo: DC w/ discharge instructions/return precautions. All questions answered to patient's satisfaction. Plan for cards f/u in clinic.  ------------------------------- Cindee Lame, MD Emergency Medicine  This note was created using dictation software, which may contain spelling or grammatical errors.   Audley Hose, MD 08/30/22 [phone]

## 2022-09-08 ENCOUNTER — Ambulatory Visit: Payer: BC Managed Care – PPO | Attending: Internal Medicine | Admitting: Internal Medicine

## 2022-09-08 ENCOUNTER — Other Ambulatory Visit: Payer: Self-pay | Admitting: Internal Medicine

## 2022-09-08 ENCOUNTER — Encounter: Payer: Self-pay | Admitting: Internal Medicine

## 2022-09-08 ENCOUNTER — Ambulatory Visit: Payer: BC Managed Care – PPO | Attending: Internal Medicine

## 2022-09-08 ENCOUNTER — Telehealth: Payer: Self-pay | Admitting: Internal Medicine

## 2022-09-08 VITALS — BP 132/88 | HR 70 | Ht 73.0 in | Wt 244.4 lb

## 2022-09-08 DIAGNOSIS — G4733 Obstructive sleep apnea (adult) (pediatric): Secondary | ICD-10-CM | POA: Diagnosis not present

## 2022-09-08 DIAGNOSIS — R002 Palpitations: Secondary | ICD-10-CM

## 2022-09-08 MED ORDER — METOPROLOL TARTRATE 50 MG PO TABS: 50.0000 mg | ORAL_TABLET | Freq: Two times a day (BID) | ORAL | 1 refills | Status: DC

## 2022-09-08 NOTE — Progress Notes (Signed)
Cardiology Office Note  Date: 09/08/2022   ID: Matthew Oconnor, DOB [birth year], MRN [id number]  PCP:  Patient, No Pcp Per  Cardiologist:  None Electrophysiologist:  None   Reason for Office Visit: Post ER visit   History of Present Illness: Matthew Oconnor is a 47 y.o. male with no PMH was referred to cardiology clinic for evaluation of palpitations.  Patient had ER visit in 08/2022 for evaluation of palpitations that has been occurring for 6 days prior to presentation. Cardiology was consulted who documented that telemetry monitor showed PAC and atrial tachycardia. He was discharged on metoprolol tartrate 25 mg twice daily. I do not have telemetry strips to review.  Patient is here for new patient visit.  After he started to take metoprolol tartrate, his palpitations have significantly decreased in severity/intensity and now lasting couple of minutes daily (rather than a couple of hours prior to ER visit). Denies any DOE, chest pain, dizziness, syncope.  He was never tested for sleep apnea, he does snore loud at nighttime and his wife said she might have noticed him to stop breathing possibly.  Patient used to drink a lot of coffee but started to cut down and had 1 and half cup of coffee in the last 2 weeks.  He drinks 1 beer every night before he sleeps.  Does not take any herbal supplements.  Past Medical History:  Diagnosis Date   Environmental allergies    Renal disorder    kidney stones    Past Surgical History:  Procedure Laterality Date   CHOLECYSTECTOMY     KNEE SURGERY      Current Outpatient Medications  Medication Sig Dispense Refill   famotidine (PEPCID) 20 MG tablet Take 20 mg by mouth daily.     fexofenadine (ALLEGRA) 180 MG tablet Take 180 mg by mouth daily.     ibuprofen (ADVIL) 200 MG tablet Take 800 mg by mouth every 6 (six) hours as needed for moderate pain.     metoprolol tartrate (LOPRESSOR) 25 MG tablet Take 1 tablet (25 mg total) by mouth 2 (two) times daily.  60 tablet 2   Multiple Vitamin (MULTIVITAMIN) tablet Take 1 tablet by mouth daily.     No current facility-administered medications for this visit.   Allergies:  Flexeril [cyclobenzaprine]   Social History: The patient  reports that he has never smoked. His smokeless tobacco use includes chew. He reports current alcohol use. He reports that he does not use drugs.   Family History: The patient's family history includes Hypertension in an other family member.   ROS:  Please see the history of present illness. Otherwise, complete review of systems is positive for none.  All other systems are reviewed and negative.   Physical Exam: VS:  There were no vitals taken for this visit., BMI There is no height or weight on file to calculate BMI.  Wt Readings from Last 3 Encounters:  08/20/22 250 lb (113.4 kg)  02/18/18 260 lb (117.9 kg)  08/12/17 250 lb (113.4 kg)    General: Patient appears comfortable at rest. HEENT: Conjunctiva and lids normal, oropharynx clear with moist mucosa. Neck: Supple, no elevated JVP or carotid bruits, no thyromegaly. Lungs: Clear to auscultation, nonlabored breathing at rest. Cardiac: Regular rate and rhythm, no S3 or significant systolic murmur, no pericardial rub. Abdomen: Soft, nontender, no hepatomegaly, bowel sounds present, no guarding or rebound. Extremities: No pitting edema, distal pulses 2+. Skin: Warm and dry. Musculoskeletal: No kyphosis. Neuropsychiatric: Alert and  oriented x3, affect grossly appropriate.  ECG:  An ECG dated 08/2022 was personally reviewed today and demonstrated:  Normal sinus rhythm  Recent Labwork: 08/20/2022: ALT 54; AST 34; BUN 9; Creatinine, Ser 0.83; Hemoglobin 16.7; Magnesium 2.0; Platelets 270; Potassium 3.7; Sodium 135; TSH 1.368  No results found for: "CHOL", "TRIG", "HDL", "CHOLHDL", "VLDL", "LDLCALC", "LDLDIRECT"  Other Studies Reviewed Today: I personally reviewed the ER notes  Assessment and Plan: Patient is a  47 year old M with no PMH was referred to cardiology clinic for evaluation of palpitations  # Palpitations likely secondary to PACs/atrial tachycardia -EKG showed normal sinus rhythm. Telemetry in the hospital showed [facility] and atrial tachycardia per cardiology documentation at the time.  Cut down coffee and of beer. Obtain 1 week event monitor, non-live.  After the event monitor is mailed out, increase metoprolol tartrate from 25 mg to 50 mg twice daily (due to daily palpitations lasting for a few minutes).  STOP-BANG score is 3, he will benefit from sleep apnea testing. Sleep medicine referral for in lab sleep study.  I have spent a total of 30 minutes with patient reviewing chart, EKGs, labs and examining patient as well as establishing an assessment and plan that was discussed with the patient.  > 50% of time was spent in direct patient care.      Medication Adjustments/Labs and Tests Ordered: Current medicines are reviewed at length with the patient today.  Concerns regarding medicines are outlined above.   Tests Ordered: No orders of the defined types were placed in this encounter.   Medication Changes: No orders of the defined types were placed in this encounter.   Disposition:  Follow up  6 months  Signed Galaxy Borden Fidel Levy, MD, 09/08/2022 7:52 AM    [facility] [place], [place], [place] [address]

## 2022-09-08 NOTE — Patient Instructions (Addendum)
Medication Instructions:  Your physician has recommended you make the following change in your medication:  Increase metoprolol tartrate to 50 mg twice daily after you finish your heart monitor Continue other medications the same  Labwork: none  Testing/Procedures: Your physician has recommended that you wear a Zio monitor.   This monitor is a medical device that records the heart's electrical activity. Doctors most often use these monitors to diagnose arrhythmias. Arrhythmias are problems with the speed or rhythm of the heartbeat. The monitor is a small device applied to your chest. You can wear one while you do your normal daily activities. While wearing this monitor if you have any symptoms to push the button and record what you felt. Once you have worn this monitor for the period of time provider prescribed (for 7 days), you will return the monitor device in the postage paid box. Once it is returned they will download the data collected and provide [place] with a report which the provider will then review and we will call you with those results. Important tips:  Avoid showering during the first 24 hours of wearing the monitor. Avoid excessive sweating to help maximize wear time. Do not submerge the device, no hot tubs, and no swimming pools. Keep any lotions or oils away from the patch. After 24 hours you may shower with the patch on. Take brief showers with your back facing the shower head.  Do not remove patch once it has been placed because that will interrupt data and decrease adhesive wear time. Push the button when you have any symptoms and write down what you were feeling. Once you have completed wearing your monitor, remove and place into box which has postage paid and place in your outgoing mailbox.  If for some reason you have misplaced your box then call our office and we can provide another box and/or mail it off for you.  Follow-Up: Your physician recommends that you schedule a  follow-up appointment in: 6 months  Any Other Special Instructions Will Be Listed Below (If Applicable). You have been referred to pulmonology  If you need a refill on your cardiac medications before your next appointment, please call your pharmacy.

## 2022-09-08 NOTE — Telephone Encounter (Signed)
7 Day ZIO XT dx: palpitations

## 2022-10-23 ENCOUNTER — Institutional Professional Consult (permissible substitution): Payer: BC Managed Care – PPO | Admitting: Internal Medicine

## 2022-10-31 ENCOUNTER — Encounter: Payer: Self-pay | Admitting: Pulmonary Disease

## 2022-10-31 ENCOUNTER — Ambulatory Visit (INDEPENDENT_AMBULATORY_CARE_PROVIDER_SITE_OTHER): Payer: BC Managed Care – PPO | Admitting: Pulmonary Disease

## 2022-10-31 VITALS — BP 139/86 | HR 67 | Ht 73.0 in | Wt 240.5 lb

## 2022-10-31 DIAGNOSIS — R0683 Snoring: Secondary | ICD-10-CM

## 2022-10-31 NOTE — Patient Instructions (Signed)
Will arrange for a home sleep study Will call to arrange for follow up after sleep study reviewed  

## 2022-10-31 NOTE — Progress Notes (Signed)
[facility]  Chief Complaint  Patient presents with   Consult    Pt consult referral by cardiology for atrial tachycardia. Pt does report he snores extremely loud     Past Surgical History:  He  has a past surgical history that includes Cholecystectomy and Knee surgery.  Past Medical History:  Allergies, Nephrolithiasis, Fatty liver, Diverticulosis, Atrial tachycardia  Constitutional:  BP 139/86   Pulse 67   Ht  (1.854 m)   Wt 240 lb 8 oz (109.1 kg)   SpO2 97%   BMI 31.73 kg/m   Brief Summary:  Matthew Oconnor is a 47 y.o. male with snoring.      Subjective:   He is here with his wife.  He is followed by cardiology for palpitations.  He was noted to have snoring and periods of apnea while asleep.  This has been present for years.  He goes to sleep at 2 am.  He falls asleep in 5 minutes.  He wakes up some times to let his dogs out.  He gets out of bed at 11 am.  He feels okay in the morning.  He denies morning headache.  He does not use anything to help him fall sleep or stay awake.  He denies sleep walking, sleep talking, bruxism, or nightmares.  There is no history of restless legs.  He denies sleep hallucinations, sleep paralysis, or cataplexy.  The Epworth score is 2 out of 24.  He is starting a new job next week.  He will have rotating shift schedule with days and nights alternating every 2 weeks.  Physical Exam:   Appearance - well kempt   ENMT - no sinus tenderness, no oral exudate, no LAN, Mallampati 4 airway, no stridor  Respiratory - equal breath sounds bilaterally, no wheezing or rales  CV - s1s2 regular rate and rhythm, no murmurs  Ext - no clubbing, no edema  Skin - no rashes  Psych - normal mood and affect   Sleep Tests:    Social History:  He  reports that he has never smoked. His smokeless tobacco use includes chew. He reports current alcohol use. He reports that he does not use  drugs.  Family History:  His family history includes Hypertension in an other family member.    Discussion:  He has snoring, sleep disruption, apnea, and daytime sleepiness.  He has history of atrial tachycardia.  He could have obstructive sleep apnea contributing to this.  Assessment/Plan:   Snoring with excessive daytime sleepiness. - will need to arrange for a home sleep study  Shift work schedule. - discussed how this can impact his sleep and daytime function  Atrial tachycardia with PACs. - followed by Dr. Luane School with cardiology  Obesity. - discussed how weight can impact sleep and risk for sleep disordered breathing - discussed options to assist with weight loss: combination of diet modification, cardiovascular and strength training exercises  Cardiovascular risk. - had an extensive discussion regarding the adverse health consequences related to untreated sleep disordered breathing - specifically discussed the risks for hypertension, coronary artery disease, cardiac dysrhythmias, cerebrovascular disease, and diabetes - lifestyle modification discussed  Safe driving practices. - discussed how sleep disruption can increase risk of accidents, particularly when driving - safe driving practices were discussed  Therapies for obstructive sleep apnea. - if the sleep study shows significant sleep apnea, then various therapies for treatment were reviewed: CPAP, oral appliance, and surgical interventions  Time  Spent Involved in Patient Care on Day of Examination:  37 minutes  Follow up:   Patient Instructions  Will arrange for a home sleep study Will call to arrange for follow up after sleep study reviewed   Medication List:   Allergies as of 10/31/2022       Reactions   Flexeril [cyclobenzaprine] Anxiety, Palpitations        Medication List        Accurate as of October 31, 2022 10:21 AM. If you have any questions, ask your nurse or doctor.           fexofenadine 180 MG tablet Commonly known as: ALLEGRA Take 180 mg by mouth daily.   ibuprofen 200 MG tablet Commonly known as: ADVIL Take 800 mg by mouth every 6 (six) hours as needed for moderate pain.   lansoprazole 15 MG capsule Commonly known as: PREVACID Take 15 mg by mouth daily at 12 noon.   metoprolol tartrate 50 MG tablet Commonly known as: LOPRESSOR Take 1 tablet (50 mg total) by mouth 2 (two) times daily. Start increase after monitor finished   multivitamin tablet Take 1 tablet by mouth daily.        Signature:  Coralyn Helling, MD [facility]/Critical Care Pager - [phone] 10/31/2022, 10:21 AM

## 2022-12-27 ENCOUNTER — Telehealth: Payer: Self-pay | Admitting: Pulmonary Disease

## 2022-12-27 ENCOUNTER — Ambulatory Visit (INDEPENDENT_AMBULATORY_CARE_PROVIDER_SITE_OTHER): Payer: BC Managed Care – PPO

## 2022-12-27 DIAGNOSIS — G4733 Obstructive sleep apnea (adult) (pediatric): Secondary | ICD-10-CM

## 2022-12-27 DIAGNOSIS — R0683 Snoring: Secondary | ICD-10-CM

## 2022-12-27 NOTE — Telephone Encounter (Signed)
HST 12/06/22 >> AHI 6.8, SpO2 low 83%   Please inform him that his sleep study shows mild obstructive sleep apnea.  Please arrange for ROV with me or NP to discuss treatment options.

## 2023-01-14 NOTE — Telephone Encounter (Signed)
Sent pt mychart message w/ results, NFN att. 

## 2023-03-09 ENCOUNTER — Encounter: Payer: Self-pay | Admitting: Internal Medicine

## 2023-03-09 ENCOUNTER — Ambulatory Visit: Payer: BC Managed Care – PPO | Attending: Internal Medicine | Admitting: Internal Medicine

## 2023-03-09 VITALS — BP 130/90 | HR 71 | Ht 73.0 in | Wt 245.4 lb

## 2023-03-09 DIAGNOSIS — R002 Palpitations: Secondary | ICD-10-CM | POA: Diagnosis not present

## 2023-03-09 DIAGNOSIS — G4733 Obstructive sleep apnea (adult) (pediatric): Secondary | ICD-10-CM | POA: Insufficient documentation

## 2023-03-09 DIAGNOSIS — I4719 Other supraventricular tachycardia: Secondary | ICD-10-CM | POA: Diagnosis not present

## 2023-03-09 MED ORDER — METOPROLOL TARTRATE 50 MG PO TABS: 50.0000 mg | ORAL_TABLET | Freq: Two times a day (BID) | ORAL | 1 refills | Status: DC

## 2023-03-09 NOTE — Progress Notes (Signed)
Cardiology Office Note  Date: 03/09/2023   ID: Matthew Oconnor, DOB 03-Nov-1975, MRN [id number]  PCP:  Patient, No Pcp Per  Cardiologist:  Marjo Bicker, MD Electrophysiologist:  None    History of Present Illness: Matthew Oconnor is a 47 y.o. male is here for follow-up visit.  Patient had ER visit in 08/2022 for evaluation of palpitations that has been occurring for 6 days prior to presentation. Cardiology was consulted who documented that telemetry monitor showed PAC and atrial tachycardia. He was discharged on metoprolol tartrate 25 mg twice daily after which his palpitations significantly decreased in severity and intensity (from hours to minutes).  Metoprolol dose was increased from 25 mg to 50 mg the last minute visit due to continued palpitations although decreased in severity/intensity. He was referred to sleep medicine for home sleep study that showed mild OSA (thinks he was diagnosed with OSA as he slept on his back with a home sleep study.  He usually sleeps on his side).  No palpitation the last few months.  Overall doing great.  No dizziness, presyncope, syncope, leg swelling, chest pain, DOE, orthopnea, PND.  Drinks 4-5 beers in 1 week.  Had 10 cups of coffee in the last few months.    Past Medical History:  Diagnosis Date   Atrial tachycardia    Diverticulosis    Environmental allergies    Fatty liver    Nephrolithiasis     Past Surgical History:  Procedure Laterality Date   CHOLECYSTECTOMY     KNEE SURGERY      Current Outpatient Medications  Medication Sig Dispense Refill   fexofenadine (ALLEGRA) 180 MG tablet Take 180 mg by mouth daily.     ibuprofen (ADVIL) 200 MG tablet Take 800 mg by mouth every 6 (six) hours as needed for moderate pain.     lansoprazole (PREVACID) 15 MG capsule Take 15 mg by mouth daily at 12 noon.     metoprolol tartrate (LOPRESSOR) 50 MG tablet Take 1 tablet (50 mg total) by mouth 2 (two) times daily. Start increase after monitor  finished 180 tablet 1   Multiple Vitamin (MULTIVITAMIN) tablet Take 1 tablet by mouth daily.     No current facility-administered medications for this visit.   Allergies:  Flexeril [cyclobenzaprine]   Social History: The patient  reports that he has never smoked. His smokeless tobacco use includes chew. He reports current alcohol use. He reports that he does not use drugs.   Family History: The patient's family history includes Hypertension in an other family member.   ROS:  Please see the history of present illness. Otherwise, complete review of systems is positive for none.  All other systems are reviewed and negative.   Physical Exam: VS:  Ht 6\' 1"  (1.854 m)   Wt 245 lb 6.4 oz (111.3 kg)   BMI 32.38 kg/m , BMI Body mass index is 32.38 kg/m.  Wt Readings from Last 3 Encounters:  03/09/23 245 lb 6.4 oz (111.3 kg)  10/31/22 240 lb 8 oz (109.1 kg)  09/08/22 244 lb 6.4 oz (110.9 kg)    General: Patient appears comfortable at rest. HEENT: Conjunctiva and lids normal, oropharynx clear with moist mucosa. Neck: Supple, no elevated JVP or carotid bruits, no thyromegaly. Lungs: Clear to auscultation, nonlabored breathing at rest. Cardiac: Regular rate and rhythm, no S3 or significant systolic murmur, no pericardial rub. Abdomen: Soft, nontender, no hepatomegaly, bowel sounds present, no guarding or rebound. Extremities: No pitting edema, distal pulses 2+.  Skin: Warm and dry. Musculoskeletal: No kyphosis. Neuropsychiatric: Alert and oriented x3, affect grossly appropriate.  Recent Labwork: 08/20/2022: ALT 54; AST 34; BUN 9; Creatinine, Ser 0.83; Hemoglobin 16.7; Magnesium 2.0; Platelets 270; Potassium 3.7; Sodium 135; TSH 1.368  No results found for: "CHOL", "TRIG", "HDL", "CHOLHDL", "VLDL", "LDLCALC", "LDLDIRECT"  Assessment and Plan: Patient is a 47 year old M with mild OSA, palpitations is here for follow-up visit.  # Palpitations likely secondary to PACs/atrial tachycardia -ER  visit in 2/24 showed PACs/atrial tachycardia per cardiology documentation. Started on metoprolol 20.5 mg twice daily that was increased to 50 mg twice daily in the last clinic visit due to continued palpitations although decreased in frequency, severity and intensity. Referred to sleep medicine for OSA evaluation, found to have mild OSA.  Repeat event monitor from 3/24 showed no evidence of atrial arrhythmias and less than 1% PAC burden. Continue metoprolol tartrate 50 mg twice daily.  # Mild OSA -Follows up with sleep medicine.  Patient thinks he was diagnosed with mild OSA as he had to undergo home sleep study while sleeping on his back.  He said he usually sleeps on his side.   Refill metoprolol tartrate  Medication Adjustments/Labs and Tests Ordered: Current medicines are reviewed at length with the patient today.  Concerns regarding medicines are outlined above.   Tests Ordered: Orders Placed This Encounter  Procedures   EKG 12-Lead    Medication Changes: No orders of the defined types were placed in this encounter.   Disposition:  Follow up  1 year  Signed Gailen Venne Verne Spurr, MD, 03/09/2023 9:27 AM    [facility] at [facility] [address] [place], [place], [place] [address]

## 2023-03-09 NOTE — Patient Instructions (Addendum)
Medication Instructions:  Your physician recommends that you continue on your current medications as directed. Please refer to the Current Medication list given to you today.   Labwork: None  Testing/Procedures: None  Follow-Up: 1 year Your physician recommends that you schedule a follow-up appointment in: You will receive a reminder call in about 10 months reminding you to schedule your appointment. If you don't receive this call, please contact our office.   Any Other Special Instructions Will Be Listed Below (If Applicable).  If you need a refill on your cardiac medications before your next appointment, please call your pharmacy.

## 2023-11-08 ENCOUNTER — Emergency Department (HOSPITAL_COMMUNITY)

## 2023-11-08 ENCOUNTER — Other Ambulatory Visit: Payer: Self-pay

## 2023-11-08 ENCOUNTER — Encounter (HOSPITAL_COMMUNITY): Payer: Self-pay | Admitting: *Deleted

## 2023-11-08 ENCOUNTER — Emergency Department (HOSPITAL_COMMUNITY)
Admission: EM | Admit: 2023-11-08 | Discharge: 2023-11-08 | Disposition: A | Discharge status: Home / Self Care | Attending: Emergency Medicine | Admitting: Emergency Medicine

## 2023-11-08 DIAGNOSIS — R109 Unspecified abdominal pain: Secondary | ICD-10-CM | POA: Insufficient documentation

## 2023-11-08 DIAGNOSIS — E1165 Type 2 diabetes mellitus with hyperglycemia: Secondary | ICD-10-CM | POA: Diagnosis not present

## 2023-11-08 DIAGNOSIS — R739 Hyperglycemia, unspecified: Secondary | ICD-10-CM | POA: Insufficient documentation

## 2023-11-08 DIAGNOSIS — K5730 Diverticulosis of large intestine without perforation or abscess without bleeding: Secondary | ICD-10-CM | POA: Diagnosis not present

## 2023-11-08 DIAGNOSIS — Z9049 Acquired absence of other specified parts of digestive tract: Secondary | ICD-10-CM | POA: Diagnosis not present

## 2023-11-08 DIAGNOSIS — K760 Fatty (change of) liver, not elsewhere classified: Secondary | ICD-10-CM | POA: Diagnosis not present

## 2023-11-08 LAB — COMPREHENSIVE METABOLIC PANEL WITH GFR
ALT: 40 U/L (ref 0–44)
AST: 22 U/L (ref 15–41)
Albumin: 4 g/dL (ref 3.5–5.0)
Alkaline Phosphatase: 65 U/L (ref 38–126)
Anion gap: 10 (ref 5–15)
BUN: 13 mg/dL (ref 6–20)
CO2: 25 mmol/L (ref 22–32)
Calcium: 9 mg/dL (ref 8.9–10.3)
Chloride: 98 mmol/L (ref 98–111)
Creatinine, Ser: 0.74 mg/dL (ref 0.61–1.24)
GFR, Estimated: >60 mL/min (ref >60)
Glucose, Bld: 234 mg/dL — ABNORMAL HIGH (ref 70–99)
Potassium: 3.5 mmol/L (ref 3.5–5.1)
Sodium: 133 mmol/L — ABNORMAL LOW (ref 135–145)
Total Bilirubin: 1.6 mg/dL — ABNORMAL HIGH (ref 0.0–1.2)
Total Protein: 7 g/dL (ref 6.5–8.1)

## 2023-11-08 LAB — CBC WITH DIFFERENTIAL/PLATELET
Abs Immature Granulocytes: 0.02 10*3/uL (ref 0.00–0.07)
Basophils Absolute: 0.1 10*3/uL (ref 0.0–0.1)
Basophils Relative: 1 %
Eosinophils Absolute: 0.2 10*3/uL (ref 0.0–0.5)
Eosinophils Relative: 3 %
HCT: 45.6 % (ref 39.0–52.0)
Hemoglobin: 15.3 g/dL (ref 13.0–17.0)
Immature Granulocytes: 0 %
Lymphocytes Relative: 32 %
Lymphs Abs: 1.9 10*3/uL (ref 0.7–4.0)
MCH: 27 pg (ref 26.0–34.0)
MCHC: 33.6 g/dL (ref 30.0–36.0)
MCV: 80.6 fL (ref 80.0–100.0)
Monocytes Absolute: 0.7 10*3/uL (ref 0.1–1.0)
Monocytes Relative: 12 %
Neutro Abs: 3.1 10*3/uL (ref 1.7–7.7)
Neutrophils Relative %: 52 %
Platelets: 250 10*3/uL (ref 150–400)
RBC: 5.66 MIL/uL (ref 4.22–5.81)
RDW: 12.9 % (ref 11.5–15.5)
WBC: 6 10*3/uL (ref 4.0–10.5)
nRBC: 0 % (ref 0.0–0.2)

## 2023-11-08 LAB — URINALYSIS, ROUTINE W REFLEX MICROSCOPIC
Bilirubin Urine: NEGATIVE
Glucose, UA: 150 mg/dL — AB
Hgb urine dipstick: NEGATIVE
Ketones, ur: 5 mg/dL — AB
Leukocytes,Ua: NEGATIVE
Nitrite: NEGATIVE
Protein, ur: NEGATIVE mg/dL
Specific Gravity, Urine: 1.019 (ref 1.005–1.030)
pH: 6 (ref 5.0–8.0)

## 2023-11-08 LAB — LIPASE, BLOOD: Lipase: 29 U/L (ref 11–51)

## 2023-11-08 MED ORDER — METFORMIN HCL 500 MG PO TABS: 500.0000 mg | ORAL_TABLET | Freq: Two times a day (BID) | ORAL | 0 refills | Status: DC

## 2023-11-08 MED ORDER — KETOROLAC TROMETHAMINE 15 MG/ML IJ SOLN
15.0000 mg | Freq: Once | INTRAMUSCULAR | Status: AC
  Administered 2023-11-08: 15 mg via INTRAVENOUS
  Filled 2023-11-08: qty 1

## 2023-11-08 MED ORDER — NAPROXEN 500 MG PO TABS: 500.0000 mg | ORAL_TABLET | Freq: Two times a day (BID) | ORAL | 0 refills | Status: DC

## 2023-11-08 MED ORDER — MORPHINE SULFATE (PF) 4 MG/ML IV SOLN
4.0000 mg | Freq: Once | INTRAVENOUS | Status: AC
  Administered 2023-11-08: 4 mg via INTRAVENOUS
  Filled 2023-11-08: qty 1

## 2023-11-08 MED ORDER — ONDANSETRON HCL 4 MG/2ML IJ SOLN
4.0000 mg | Freq: Once | INTRAMUSCULAR | Status: AC
  Administered 2023-11-08: 4 mg via INTRAVENOUS
  Filled 2023-11-08: qty 2

## 2023-11-08 MED ORDER — SODIUM CHLORIDE 0.9 % IV BOLUS
1000.0000 mL | Freq: Once | INTRAVENOUS | Status: AC
  Administered 2023-11-08: 1000 mL via INTRAVENOUS

## 2023-11-08 NOTE — Discharge Instructions (Addendum)
 Please follow-up closely with a primary care doctor on an outpatient basis.  Please ensure that you follow-up with your primary care doctor to continue to monitor your blood glucose.  Return to emergency department immediately for any new or worsening symptoms.  [facility] Primary Care Doctor List    Alberteen Huge, MD. Specialty: [facility] Medicine Contact information: [address]  [place] [address]  [phone]   Charlotta Cook, MD. Specialty: [facility] Medicine Contact information: [address]  [place] [address]  [phone]   Clary Crown, MD Specialty: Internal Medicine Contact information: [address] [place] [address]  [phone]   Lucendia Rusk, MD. Specialty: Internal Medicine Contact information: [address]  [place] [address]  [phone]    [facility] (Dr. Burdette Carolin) Specialty: Family Medicine Contact information: [address]  [place] [address]  [phone]   Darrall Ellison, MD. Specialty: [facility] Medicine Contact information: [address]  [address]  [place] [address]  [phone]   Artemisa Bile, MD. Specialty: Internal Medicine Contact information: [address]  [address]  [place] [address]  [phone]   [facility]: [address] [phone]  Selene Dais, Family medicine [address]  [phone]  [facility] [address] [place], [place] [phone]  [facility]: [address] [phone]    [facility]  [address] [place], [place] [address] [phone]  Services [facility] offers a variety of basic health services.  Services include but are not limited to: Blood pressure checks  Heart rate checks  Blood sugar checks  Urine analysis  Rapid strep tests  Pregnancy tests.  Health education and referrals  People needing more  complex services will be directed to a physician online. Using these virtual visits, doctors can evaluate and prescribe medicine and treatments. There will be no medication on-site, though [facility] will help patients fill their prescriptions at little to no cost.   For More information please go to: [email or web address]  Allergy and Asthma:    [address] Selene Dais [phone]  Urology:  [address]  [place] [phone]  [facility]  [address] [place], [place] [phone]  Orthopedics   [address] [place], [place] [phone]  Endocrinology  [address] [place], [place] [phone]  Podiatry: [facility] [phone]

## 2023-11-08 NOTE — ED Provider Notes (Signed)
 [place] EMERGENCY DEPARTMENT AT [facility] Provider Note   CSN: [id number] Arrival date & time: 11/08/23  1129     History  Chief Complaint  Patient presents with   Flank Pain    Matthew Oconnor is a 48 y.o. male.  Patient is a 48 year old male who presents emergency department the chief complaint of left-sided flank pain which has been ongoing since this morning.  Patient notes that he has a history of kidney stones.  Patient denies any associated vomiting but does admit to associated nausea.  He has had no diarrhea or constipation.  He denies any dysuria or hematuria.  He denies any chest pain or shortness of breath.  He has had no associated abnormal headaches or pain to the neck.  He has been no associated abnormal rashes.   Flank Pain       Home Medications Prior to Admission medications   Medication Sig Start Date End Date Taking? Authorizing Provider  naproxen (NAPROSYN) 500 MG tablet Take 1 tablet (500 mg total) by mouth 2 (two) times daily. 11/08/23  Yes Roselynn Connors, PA-C  fexofenadine (ALLEGRA) 180 MG tablet Take 180 mg by mouth daily.    [provider]  ibuprofen  (ADVIL ) 200 MG tablet Take 800 mg by mouth every 6 (six) hours as needed for moderate pain.    [provider]  lansoprazole (PREVACID) 15 MG capsule Take 15 mg by mouth daily at 12 noon.    [provider]  metoprolol  tartrate (LOPRESSOR ) 50 MG tablet Take 1 tablet (50 mg total) by mouth 2 (two) times daily. Start increase after monitor finished 03/09/23   Mallipeddi, Vishnu P, MD  Multiple Vitamin (MULTIVITAMIN) tablet Take 1 tablet by mouth daily.    [provider]      Allergies    Flexeril [cyclobenzaprine]    Review of Systems   Review of Systems  Genitourinary:  Positive for flank pain.  All other systems reviewed and are negative.   Physical Exam Updated Vital Signs BP (!) 159/109 (BP Location: Right Arm)   Pulse 72   Temp 97.7 F  (36.5 C)   Resp 17   Ht 6\' 1"  (1.854 m)   Wt 111.1 kg   SpO2 97%   BMI 32.32 kg/m  Physical Exam Vitals and nursing note reviewed.  Constitutional:      Appearance: Normal appearance.  HENT:     Head: Normocephalic and atraumatic.     Nose: Nose normal.     Mouth/Throat:     Mouth: Mucous membranes are moist.  Eyes:     Extraocular Movements: Extraocular movements intact.     Conjunctiva/sclera: Conjunctivae normal.     Pupils: Pupils are equal, round, and reactive to light.  Cardiovascular:     Rate and Rhythm: Normal rate and regular rhythm.     Pulses: Normal pulses.     Heart sounds: Normal heart sounds. No murmur heard.    No gallop.  Pulmonary:     Effort: Pulmonary effort is normal. No respiratory distress.     Breath sounds: Normal breath sounds. No wheezing.  Abdominal:     General: Abdomen is flat. Bowel sounds are normal. There is no distension.     Palpations: Abdomen is soft.     Tenderness: There is no abdominal tenderness. There is no guarding.  Musculoskeletal:        General: No swelling, tenderness, deformity or signs of injury. Normal range of motion.  Cervical back: Normal range of motion and neck supple.     Right lower leg: No edema.     Left lower leg: No edema.  Skin:    General: Skin is warm and dry.     Findings: No rash.  Neurological:     General: No focal deficit present.     Mental Status: He is alert and oriented to person, place, and time. Mental status is at baseline.  Psychiatric:        Mood and Affect: Mood normal.        Behavior: Behavior normal.        Thought Content: Thought content normal.        Judgment: Judgment normal.     ED Results / Procedures / Treatments   Labs (all labs ordered are listed, but only abnormal results are displayed) Labs Reviewed  COMPREHENSIVE METABOLIC PANEL WITH GFR - Abnormal; Notable for the following components:      Result Value   Sodium 133 (*)    Glucose, Bld 234 (*)    Total  Bilirubin 1.6 (*)    All other components within normal limits  URINALYSIS, ROUTINE W REFLEX MICROSCOPIC - Abnormal; Notable for the following components:   Glucose, UA 150 (*)    Ketones, ur 5 (*)    All other components within normal limits  LIPASE, BLOOD  CBC WITH DIFFERENTIAL/PLATELET    EKG None  Radiology CT Renal Stone Study Result Date: 11/08/2023 CLINICAL DATA:  Abdominal/flank pain, stone suspected EXAM: CT ABDOMEN AND PELVIS WITHOUT CONTRAST TECHNIQUE: Multidetector CT imaging of the abdomen and pelvis was performed following the standard protocol without IV contrast. RADIATION DOSE REDUCTION: This exam was performed according to the departmental dose-optimization program which includes automated exposure control, adjustment of the mA and/or kV according to patient size and/or use of iterative reconstruction technique. COMPARISON:  02/18/2018. FINDINGS: Lower chest: No acute abnormality. No pleural or pericardial effusions. Hepatobiliary: Decreased attenuation consistent with hepatic fatty infiltration. No focal liver abnormality is seen. Gallbladder has been removed. Pancreas: Unremarkable. No pancreatic ductal dilatation or surrounding inflammatory changes. Spleen: Normal in size without focal abnormality. Adrenals/Urinary Tract: Adrenal glands are unremarkable. Kidneys are normal, without renal calculi, focal lesion, or hydronephrosis. Bladder is unremarkable. Stomach/Bowel: Stomach is within normal limits. Appendix appears normal. No evidence of bowel wall thickening, distention, or inflammatory changes. Diverticula descending and sigmoid colon. Vascular/Lymphatic: No significant vascular findings are present. No enlarged abdominal or pelvic lymph nodes. Reproductive:Prostate and seminal vesicles unremarkable. Other: No abdominal wall hernia or abnormality. No abdominopelvic ascites. Musculoskeletal: No acute or significant osseous findings. Lumbosacral degenerative disc disease with  L5-S1 disc space narrowing, sclerosis and osteophytes. IMPRESSION: 1. Fatty infiltration of the liver. 2. Diverticulosis. 3. No obstructive uropathy. Electronically Signed   By: Sydell Eva M.D.   On: 11/08/2023 13:07    Procedures Procedures    Medications Ordered in ED Medications  morphine  (PF) 4 MG/ML injection 4 mg (4 mg Intravenous Given 11/08/23 1201)  ondansetron  (ZOFRAN ) injection 4 mg (4 mg Intravenous Given 11/08/23 1201)  ketorolac  (TORADOL ) 15 MG/ML injection 15 mg (15 mg Intravenous Given 11/08/23 1201)  sodium chloride  0.9 % bolus 1,000 mL (1,000 mLs Intravenous Bolus from Bag 11/08/23 1201)    ED Course/ Medical Decision Making/ A&P                                 Medical Decision  Making Amount and/or Complexity of Data Reviewed Labs: ordered. Radiology: ordered.  Risk Prescription drug management.   This patient presents to the ED for concern of left flank pain differential diagnosis includes muscle strain, muscle spasm, pyelonephritis, kidney stone, fracture    Additional history obtained:  Additional history obtained from none External records from outside source obtained and reviewed including none   Lab Tests:  I Ordered, and personally interpreted labs.  The pertinent results include: Hyperglycemia, normal electrolytes, normal kidney function liver function, no leukocytosis, no anemia, unremarkable urinalysis   Imaging Studies ordered:  I ordered imaging studies including CT scan of abdomen and pelvis I independently visualized and interpreted imaging which showed no acute surgical process or ureteral stone I agree with the radiologist interpretation   Medicines ordered and prescription drug management:  I ordered medication including naproxen for acute pain Reevaluation of the patient after these medicines showed that the patient improved I have reviewed the patients home medicines and have made adjustments as needed   Problem List / ED  Course:  Patient is doing well at this time and is stable for discharge home.  Discussed with patient that CT scan abdomen pelvis demonstrated no acute pathology to include ureteral stones.  He has no indication for acute appendicitis, cholecystitis, cervalgia, diverticulitis, testicular torsion, pancreatitis, mesenteric ischemia.  He has no indication for pyelonephritis on urinalysis.  He was made aware of the hyperglycemia and directed to follow-up closely with his primary care doctor for reevaluation.  He has no indication for DKA or HHS.  He does not have a known history of diabetes.  Will start patient on metformin at this time.  Strict turn precautions were discussed for any new or worsening symptoms.  Patient voiced understanding and had no additional questions.   Social Determinants of Health:  None           Final Clinical Impression(s) / ED Diagnoses Final diagnoses:  Right flank pain    Rx / DC Orders ED Discharge Orders          Ordered    naproxen (NAPROSYN) 500 MG tablet  2 times daily        11/08/23 1331              Edison Wollschlager D, PA-C 11/08/23 1341    Ninetta Basket, MD 11/11/23 [phone]

## 2023-11-08 NOTE — ED Triage Notes (Signed)
 Pt with left flank pain for past 1.5 hours.  Pt with hx of kidney stones.

## 2024-02-15 ENCOUNTER — Telehealth: Payer: Self-pay | Admitting: Internal Medicine

## 2024-02-15 MED ORDER — METOPROLOL TARTRATE 50 MG PO TABS: 50.0000 mg | ORAL_TABLET | Freq: Two times a day (BID) | ORAL | 0 refills | Status: DC

## 2024-02-15 NOTE — Telephone Encounter (Signed)
*  STAT* If patient is at the pharmacy, call can be transferred to refill team.   1. Which medications need to be refilled? (please list name of each medication and dose if known)  metoprolol  tartrate (LOPRESSOR ) 50 MG tablet   2. Which pharmacy/location (including street and city if local pharmacy) is medication to be sent to? [facility] - [place], [place] - [address]    3. Do they need a 30 day or 90 day supply?  90 day supply

## 2024-03-17 ENCOUNTER — Encounter: Payer: Self-pay | Admitting: Internal Medicine

## 2024-03-17 ENCOUNTER — Ambulatory Visit: Attending: Internal Medicine | Admitting: Internal Medicine

## 2024-03-17 VITALS — BP 140/88 | HR 87 | Ht 73.0 in | Wt 243.8 lb

## 2024-03-17 DIAGNOSIS — G4733 Obstructive sleep apnea (adult) (pediatric): Secondary | ICD-10-CM

## 2024-03-17 DIAGNOSIS — I4719 Other supraventricular tachycardia: Secondary | ICD-10-CM | POA: Diagnosis not present

## 2024-03-17 DIAGNOSIS — R739 Hyperglycemia, unspecified: Secondary | ICD-10-CM | POA: Insufficient documentation

## 2024-03-17 DIAGNOSIS — R002 Palpitations: Secondary | ICD-10-CM | POA: Diagnosis not present

## 2024-03-17 HISTORY — DX: Hyperglycemia, unspecified: R73.9

## 2024-03-17 MED ORDER — METOPROLOL TARTRATE 50 MG PO TABS: 50.0000 mg | ORAL_TABLET | Freq: Two times a day (BID) | ORAL | 2 refills | Status: AC

## 2024-03-17 NOTE — Patient Instructions (Addendum)
 Medication Instructions:  Your physician recommends that you continue on your current medications as directed. Please refer to the Current Medication list given to you today.   Labwork: HbA1C to be completed today at [facility] in [place]   Testing/Procedures: None   Follow-Up: Your physician recommends that you schedule a follow-up appointment in: 1 year. You will receive a reminder call in about 8 months reminding you to schedule your appointment. If you don't receive this call, please contact our office.   Any Other Special Instructions Will Be Listed Below (If Applicable). Referral to Sleep Medicine   Thank you for choosing  [facility]!     If you need a refill on your cardiac medications before your next appointment, please call your pharmacy.

## 2024-03-17 NOTE — Progress Notes (Signed)
 Cardiology Office Note  Date: 03/17/2024   ID: Matthew Oconnor, DOB 1975/12/09, MRN [id number]  PCP:  Patient, No Pcp Per  Cardiologist:  Diannah SHAUNNA Maywood, MD Electrophysiologist:  None    History of Present Illness: Matthew Oconnor is a 48 y.o. male is here for follow-up visit.  2024: Patient had ER visit in 08/2022 for evaluation of palpitations that has been occurring for 6 days prior to presentation. Cardiology was consulted who documented that telemetry monitor showed PAC and atrial tachycardia. He was discharged on metoprolol  tartrate 25 mg twice daily after which his palpitations significantly decreased in severity and intensity (from hours to minutes).  Metoprolol  dose was increased from 25 mg to 50 mg due to continued palpitations although decreased in severity/intensity. He was referred to sleep medicine for home sleep study that showed mild OSA.  Patient is here today for follow-up visit.   Occasional palpitations, lasting for about 4 to 5 minutes.  Sometimes occurs 1-2 times per week and sometimes once in a few months.  Sporadic.  Does not have any other symptoms like chest pain, SOB, dizziness, syncope, leg swelling.  He is worried about elevated blood sugars when he had ER visit this year in April 2025.  He was discharged on metformin  500 mg twice daily but he received only 30-day supply.  He did not have any PCP appointment until November 2025.  1  Past Medical History:  Diagnosis Date   Atrial tachycardia (HCC)    Diverticulosis    Environmental allergies    Fatty liver    Nephrolithiasis     Past Surgical History:  Procedure Laterality Date   CHOLECYSTECTOMY     KNEE SURGERY      Current Outpatient Medications  Medication Sig Dispense Refill   fexofenadine (ALLEGRA) 180 MG tablet Take 180 mg by mouth daily.     ibuprofen  (ADVIL ) 200 MG tablet Take 800 mg by mouth every 6 (six) hours as needed for moderate pain.     lansoprazole (PREVACID) 15 MG capsule Take 15 mg  by mouth daily at 12 noon.     metoprolol  tartrate (LOPRESSOR ) 50 MG tablet Take 1 tablet (50 mg total) by mouth 2 (two) times daily. Start increase after monitor finished 180 tablet 0   Multiple Vitamin (MULTIVITAMIN) tablet Take 1 tablet by mouth daily.     metFORMIN  (GLUCOPHAGE ) 500 MG tablet Take 1 tablet (500 mg total) by mouth 2 (two) times daily with a meal. (Patient not taking: Reported on 03/17/2024) 60 tablet 0   No current facility-administered medications for this visit.   Allergies:  Flexeril [cyclobenzaprine]   Social History: The patient  reports that he has never smoked. His smokeless tobacco use includes chew. He reports current alcohol use. He reports that he does not use drugs.   Family History: The patient's family history includes Hypertension in an other family member.   ROS:  Please see the history of present illness. Otherwise, complete review of systems is positive for none.  All other systems are reviewed and negative.   Physical Exam: VS:  BP (!) 140/88   Pulse 87   Ht 6' 1 (1.854 m)   Wt 243 lb 12.8 oz (110.6 kg)   SpO2 97%   BMI 32.17 kg/m , BMI Body mass index is 32.17 kg/m.  Wt Readings from Last 3 Encounters:  03/17/24 243 lb 12.8 oz (110.6 kg)  11/08/23 245 lb (111.1 kg)  03/09/23 245 lb 6.4 oz (111.3 kg)  General: Patient appears comfortable at rest. HEENT: Conjunctiva and lids normal, oropharynx clear with moist mucosa. Neck: Supple, no elevated JVP or carotid bruits, no thyromegaly. Lungs: Clear to auscultation, nonlabored breathing at rest. Cardiac: Regular rate and rhythm, no S3 or significant systolic murmur, no pericardial rub. Abdomen: Soft, nontender, no hepatomegaly, bowel sounds present, no guarding or rebound. Extremities: No pitting edema, distal pulses 2+. Skin: Warm and dry. Musculoskeletal: No kyphosis. Neuropsychiatric: Alert and oriented x3, affect grossly appropriate.  Recent Labwork: 11/08/2023: ALT 40; AST 22; BUN 13;  Creatinine, Ser 0.74; Hemoglobin 15.3; Platelets 250; Potassium 3.5; Sodium 133  No results found for: CHOL, TRIG, HDL, CHOLHDL, VLDL, LDLCALC, LDLDIRECT  Assessment and Plan:   # Palpitations likely secondary to PACs/atrial tachycardia - Occasional palpitations, lasting between 4 and 5 minutes (sometimes occurs 1-2 times per week and sometimes once in a few months). - ER visit in 2/24 showed PACs/atrial tachycardia per cardiology documentation. Started on metoprolol   - Event monitor in March 2024 showed no evidence of atrial arrhythmias and less than 1% PAC burden. - Occasional caffeine intake.  After he stopped caffeine/tea and energy drinks, his palpitations significant improved. - Continue metoprolol  tartrate 50 mg twice daily. - Diagnosed with mild OSA.  Follows up with Dr. Shellia, will refer him back to sleep medicine.  # Mild OSA - Follows up with sleep medicine, Dr Shellia.  Will refer him back to sleep medicine.  # Hyperglycemia, r/o DM2 - I reviewed his CMP that showed elevated blood glucose levels more than 100 with last 6 years.  Recently it was around 200s.  He was started on metformin  500 mg twice daily by ER in April 25.  Obtain HbA1c.   Refill metoprolol  tartrate  30-minute spent in reviewing prior records, more than 3 labs, discussion of the above problems with the patient and documentation.  Counseling provided.  I answered all his questions.  Medication Adjustments/Labs and Tests Ordered: Current medicines are reviewed at length with the patient today.  Concerns regarding medicines are outlined above.   Tests Ordered: Orders Placed This Encounter  Procedures   EKG 12-Lead    Medication Changes: No orders of the defined types were placed in this encounter.   Disposition:  Follow up 1 year  Signed Krishan Mcbreen Priya Amilcar Reever, MD, 03/17/2024 8:55 AM    [facility] [address] [place], [place], [place] [address]

## 2024-03-18 LAB — HEMOGLOBIN A1C
Est. average glucose Bld gHb Est-mCnc: 252 mg/dL
Hgb A1c MFr Bld: 10.4 % — ABNORMAL HIGH (ref 4.8–5.6)

## 2024-03-22 ENCOUNTER — Ambulatory Visit: Payer: Self-pay | Admitting: Internal Medicine

## 2024-03-22 MED ORDER — METFORMIN HCL 500 MG PO TABS: 500.0000 mg | ORAL_TABLET | Freq: Two times a day (BID) | ORAL | 3 refills | Status: AC

## 2024-03-22 NOTE — Telephone Encounter (Signed)
-----   Message from Vishnu P Mallipeddi sent at 03/22/2024  1:34 PM EDT ----- HbA1C 10.4. Start Metformin  500 mg BID. He needs to establish care with PCP for DM2 management. ----- Message ----- From: Interface, [facility] Lab Results In Sent: 03/18/2024   5:39 AM EDT To: Vishnu P Mallipeddi, MD

## 2024-03-22 NOTE — Telephone Encounter (Signed)
 Left patient message to call the office also sent patient MyChart message with instructions

## 2024-03-22 NOTE — Progress Notes (Signed)
 HbA1C 10.4. Start Metformin  500 mg BID. He needs to establish care with PCP for DM2 management.

## 2024-03-23 NOTE — Telephone Encounter (Signed)
 Pt called in to inform he read mychart message and will pick up rx today.

## 2024-05-26 ENCOUNTER — Telehealth: Payer: Self-pay | Admitting: Pharmacy Technician

## 2024-05-26 ENCOUNTER — Ambulatory Visit: Payer: Self-pay

## 2024-05-26 ENCOUNTER — Other Ambulatory Visit (HOSPITAL_COMMUNITY): Payer: Self-pay

## 2024-05-26 VITALS — BP 120/86 | HR 82 | Ht 73.0 in | Wt 248.0 lb

## 2024-05-26 DIAGNOSIS — Z7984 Long term (current) use of oral hypoglycemic drugs: Secondary | ICD-10-CM

## 2024-05-26 DIAGNOSIS — Z720 Tobacco use: Secondary | ICD-10-CM | POA: Diagnosis not present

## 2024-05-26 DIAGNOSIS — E1165 Type 2 diabetes mellitus with hyperglycemia: Secondary | ICD-10-CM | POA: Diagnosis not present

## 2024-05-26 MED ORDER — OZEMPIC (0.25 OR 0.5 MG/DOSE) 2 MG/3ML ~~LOC~~ SOPN: 0.2500 mg | PEN_INJECTOR | SUBCUTANEOUS | 2 refills | Status: AC

## 2024-05-26 NOTE — Assessment & Plan Note (Signed)
 Previous A1C was 10.4 at ER visit in September.  Managed with metformin , causing mild gastrointestinal side effects. Uncertain efficacy in glycemic control. A1C in office today was 9.3, therefore we discussed adding Ozempic for glycemic control and weight loss.  Discussed titration schedule for Ozempic.  Continue with low carbohydrate diet and eliminating concentrated sweets.  Recommend follow-up in 3 months for follow-up and recheck of labs   Lab Results  Component Value Date   HGBA1C 10.4 (H) 03/17/2024

## 2024-05-26 NOTE — Assessment & Plan Note (Addendum)
 Tobacco cessation instruction/counseling given:  Counseled patient on the dangers of tobacco use contribution in place and his diabetes.  Reviewed strategies to help with cessation. Three minutes spent on smoking cessation.

## 2024-05-26 NOTE — Telephone Encounter (Signed)
 Pharmacy Patient Advocate Encounter   Received notification from Onbase that prior authorization for Ozempic (0.25 or 0.5 MG/DOSE) 2MG /3ML pen-injectors is required/requested.   Insurance verification completed.   The patient is insured through [employer].   Per test claim: PA required; PA submitted to above mentioned insurance via Latent Key/confirmation #/EOC [id number] Status is pending

## 2024-05-26 NOTE — Telephone Encounter (Signed)
 Pharmacy Patient Advocate Encounter  Received notification from [employer] that Prior Authorization for Ozempic (0.25 or 0.5 MG/DOSE) 2MG /3ML pen-injectors  has been APPROVED from 04/26/2024 to 05/26/2025. Ran test claim, Copay is $24.99. This test claim was processed through [facility]- copay amounts may vary at other pharmacies due to pharmacy/plan contracts, or as the patient moves through the different stages of their insurance plan.   PA #/Case ID/Reference #: [id number]

## 2024-05-26 NOTE — Progress Notes (Signed)
 New Patient Office Visit  Subjective    Patient ID: Matthew Oconnor, male    DOB: 1976-02-09  Age: 48 y.o. MRN: [id number]  CC:  Chief Complaint  Patient presents with   Establish Care    Establish care and Diabetes     HPI Matthew Oconnor presents to establish care Discussed the use of AI scribe software for clinical note transcription with the patient, who gave verbal consent to proceed.  History of Present Illness    Matthew Oconnor is a 48 year old male with diabetes who presents for establishment of care and management of his diabetes. He is accompanied by his wife, Lesley.  Hyperglycemia and diabetes management - Diabetes initially identified during an ER visit for palpitations. - Currently treated with metformin , continued by cardiologist. - Concern regarding metformin  effectiveness and gastrointestinal discomfort. - No home blood glucose monitoring; unaware of current levels. - Previous hemoglobin A1c of 10; awaiting recheck today. - No lower extremity swelling or burning. - Family history of diabetes: both parents on diabetes medications; father had A1c of 14; mother on metformin  for prediabetes.  Weight gain and dietary habits - Weight gain of five pounds since April; current weight 248 pounds. - Frustration with inability to lose weight despite lifestyle changes. - Poor eating habits, exacerbated by night shift work schedule. - Typically eats two to three times during workdays and only one meal on days off. - Significant reduction in soda intake since April.  Musculoskeletal symptoms - History of knee issues due to work-related injury. - Two prior knee surgeries; ongoing knee problems.      Outpatient Encounter Medications as of 05/26/2024  Medication Sig   fexofenadine (ALLEGRA) 180 MG tablet Take 180 mg by mouth daily.   ibuprofen  (ADVIL ) 200 MG tablet Take 800 mg by mouth every 6 (six) hours as needed for moderate pain.   lansoprazole (PREVACID) 15 MG capsule Take  15 mg by mouth daily at 12 noon.   metFORMIN  (GLUCOPHAGE ) 500 MG tablet Take 1 tablet (500 mg total) by mouth 2 (two) times daily with a meal.   metoprolol  tartrate (LOPRESSOR ) 50 MG tablet Take 1 tablet (50 mg total) by mouth 2 (two) times daily. Start increase after monitor finished   Multiple Vitamin (MULTIVITAMIN) tablet Take 1 tablet by mouth daily.   Semaglutide,0.25 or 0.5MG /DOS, (OZEMPIC, 0.25 OR 0.5 MG/DOSE,) 2 MG/3ML SOPN Inject 0.25 mg into the skin every 7 (seven) days.   No facility-administered encounter medications on file as of 05/26/2024.    Past Medical History:  Diagnosis Date   Atrial tachycardia    Diverticulosis    Environmental allergies    Fatty liver    Hyperglycemia 03/17/2024   Nephrolithiasis     Past Surgical History:  Procedure Laterality Date   CHOLECYSTECTOMY     KNEE SURGERY      Family History  Problem Relation Age of Onset   Hypertension Other     Social History   Socioeconomic History   Marital status: Married    Spouse name: Not on file   Number of children: Not on file   Years of education: Not on file   Highest education level: Not on file  Occupational History   Not on file  Tobacco Use   Smoking status: Never   Smokeless tobacco: Current    Types: Chew  Vaping Use   Vaping status: Never Used  Substance and Sexual Activity   Alcohol use: Yes    Comment: 4 drinks  a week   Drug use: No   Sexual activity: Not on file  Other Topics Concern   Not on file  Social History Narrative   Not on file   Social Drivers of Health   Financial Resource Strain: Not on file  Food Insecurity: Not on file  Transportation Needs: Not on file  Physical Activity: Not on file  Stress: Not on file (05/19/2023)  Social Connections: Not on file  Intimate Partner Violence: Not on file    ROS     Objective    BP 120/86 (BP Location: Left Arm, Patient Position: Sitting, Cuff Size: Normal)   Pulse 82   Ht 6' 1 (1.854 m)   Wt 248 lb  (112.5 kg)   SpO2 97%   BMI 32.72 kg/m   Physical Exam Vitals and nursing note reviewed.  Constitutional:      Appearance: Normal appearance.  HENT:     Head: Normocephalic.     Right Ear: Tympanic membrane, ear canal and external ear normal. There is impacted cerumen.     Left Ear: Tympanic membrane, ear canal and external ear normal.     Nose: Nose normal.     Mouth/Throat:     Mouth: Mucous membranes are moist.     Pharynx: Oropharynx is clear.  Eyes:     Extraocular Movements: Extraocular movements intact.     Pupils: Pupils are equal, round, and reactive to light.  Cardiovascular:     Rate and Rhythm: Normal rate and regular rhythm.  Pulmonary:     Effort: Pulmonary effort is normal.     Breath sounds: Normal breath sounds.  Musculoskeletal:     Cervical back: Normal range of motion and neck supple.  Skin:    General: Skin is warm and dry.  Neurological:     Mental Status: He is alert and oriented to person, place, and time.  Psychiatric:        Mood and Affect: Mood normal.        Thought Content: Thought content normal.         Assessment & Plan:   Problem List Items Addressed This Visit       Endocrine   Type 2 diabetes mellitus with hyperglycemia (HCC) - Primary (Chronic)   Previous A1C was 10.4 at ER visit in September.  Managed with metformin , causing mild gastrointestinal side effects. Uncertain efficacy in glycemic control. A1C in office today was 9.3, therefore we discussed adding Ozempic for glycemic control and weight loss.  Discussed titration schedule for Ozempic.  Continue with low carbohydrate diet and eliminating concentrated sweets.  Recommend follow-up in 3 months for follow-up and recheck of labs   Lab Results  Component Value Date   HGBA1C 10.4 (H) 03/17/2024         Relevant Medications   Semaglutide,0.25 or 0.5MG /DOS, (OZEMPIC, 0.25 OR 0.5 MG/DOSE,) 2 MG/3ML SOPN   Other Relevant Orders   Bayer DCA Hb A1c Waived     Other    Tobacco use (Chronic)   Tobacco cessation instruction/counseling given:  Counseled patient on the dangers of tobacco use contribution in place and his diabetes.  Reviewed strategies to help with cessation. Three minutes spent on smoking cessation.         Return in about 3 months (around 08/26/2024) for chronic follow-up with PCP.   Leita Longs, FNP

## 2024-05-28 LAB — BAYER DCA HB A1C WAIVED: HB A1C (BAYER DCA - WAIVED): 9.3 % — ABNORMAL HIGH (ref 4.8–5.6)

## 2024-09-14 ENCOUNTER — Ambulatory Visit
